# Patient Record
Sex: Female | Born: 1990
Health system: Southern US, Community
[De-identification: ages and names within clinical notes are randomized; demographics above are authoritative.]

## PROBLEM LIST (undated history)

## (undated) DIAGNOSIS — J45909 Unspecified asthma, uncomplicated: Secondary | ICD-10-CM

## (undated) DIAGNOSIS — N200 Calculus of kidney: Secondary | ICD-10-CM

## (undated) DIAGNOSIS — D571 Sickle-cell disease without crisis: Secondary | ICD-10-CM

## (undated) DIAGNOSIS — R109 Unspecified abdominal pain: Secondary | ICD-10-CM

## (undated) DIAGNOSIS — N289 Disorder of kidney and ureter, unspecified: Secondary | ICD-10-CM

## (undated) HISTORY — PX: UMBILICAL HERNIA REPAIR: SHX196

## (undated) HISTORY — PX: HERNIA REPAIR: SHX51

## (undated) HISTORY — DX: Sickle-cell disease without crisis: D57.1

## (undated) HISTORY — DX: Unspecified abdominal pain: R10.9

---
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
## 2012-09-16 ENCOUNTER — Emergency Department (HOSPITAL_COMMUNITY)
Admission: EM | Admit: 2012-09-16 | Discharge: 2012-09-16 | Disposition: A | Discharge status: Home / Self Care | Payer: Medicaid Other | Attending: Emergency Medicine | Admitting: Emergency Medicine

## 2012-09-16 ENCOUNTER — Encounter (HOSPITAL_COMMUNITY): Payer: Self-pay | Admitting: *Deleted

## 2012-09-16 DIAGNOSIS — IMO0001 Reserved for inherently not codable concepts without codable children: Secondary | ICD-10-CM | POA: Insufficient documentation

## 2012-09-16 DIAGNOSIS — R112 Nausea with vomiting, unspecified: Secondary | ICD-10-CM | POA: Insufficient documentation

## 2012-09-16 DIAGNOSIS — F17200 Nicotine dependence, unspecified, uncomplicated: Secondary | ICD-10-CM | POA: Insufficient documentation

## 2012-09-16 DIAGNOSIS — J111 Influenza due to unidentified influenza virus with other respiratory manifestations: Secondary | ICD-10-CM | POA: Insufficient documentation

## 2012-09-16 DIAGNOSIS — J020 Streptococcal pharyngitis: Secondary | ICD-10-CM

## 2012-09-16 DIAGNOSIS — D571 Sickle-cell disease without crisis: Secondary | ICD-10-CM | POA: Insufficient documentation

## 2012-09-16 LAB — CBC WITH DIFFERENTIAL/PLATELET
Basophils Absolute: 0 10*3/uL (ref 0.0–0.1)
Lymphs Abs: 1.7 10*3/uL (ref 0.7–4.0)
MCV: 75.5 fL — ABNORMAL LOW (ref 78.0–100.0)
Monocytes Absolute: 3.6 10*3/uL — ABNORMAL HIGH (ref 0.1–1.0)
Monocytes Relative: 11 % (ref 3–12)
Neutrophils Relative %: 84 % — ABNORMAL HIGH (ref 43–77)
Platelets: 185 10*3/uL (ref 150–400)
RDW: 13.7 % (ref 11.5–15.5)
WBC: 33 10*3/uL — ABNORMAL HIGH (ref 4.0–10.5)

## 2012-09-16 LAB — COMPREHENSIVE METABOLIC PANEL
AST: 30 U/L (ref 0–37)
Albumin: 3.6 g/dL (ref 3.5–5.2)
Calcium: 9.5 mg/dL (ref 8.4–10.5)
Creatinine, Ser: 0.81 mg/dL (ref 0.50–1.10)
Sodium: 135 mEq/L (ref 135–145)

## 2012-09-16 LAB — RAPID STREP SCREEN (MED CTR MEBANE ONLY): Streptococcus, Group A Screen (Direct): POSITIVE — AB

## 2012-09-16 MED ORDER — LIDOCAINE VISCOUS 2 % MT SOLN: 10.0000 mL | Freq: Once | OROMUCOSAL | Status: DC

## 2012-09-16 MED ORDER — PENICILLIN G BENZATHINE 1200000 UNIT/2ML IM SUSP
1.2000 10*6.[IU] | Freq: Once | INTRAMUSCULAR | Status: AC
  Administered 2012-09-16: 1.2 10*6.[IU] via INTRAMUSCULAR
  Filled 2012-09-16: qty 2

## 2012-09-16 MED ORDER — SODIUM CHLORIDE 0.9 % IV BOLUS (SEPSIS)
1000.0000 mL | Freq: Once | INTRAVENOUS | Status: AC
  Administered 2012-09-16: 1000 mL via INTRAVENOUS

## 2012-09-16 MED ORDER — LIDOCAINE VISCOUS 2 % MT SOLN
20.0000 mL | Freq: Once | OROMUCOSAL | Status: AC
  Administered 2012-09-16: 15 mL via OROMUCOSAL
  Filled 2012-09-16: qty 15

## 2012-09-16 MED ORDER — DEXAMETHASONE SODIUM PHOSPHATE 10 MG/ML IJ SOLN
10.0000 mg | Freq: Once | INTRAMUSCULAR | Status: AC
  Administered 2012-09-16: 10 mg via INTRAVENOUS
  Filled 2012-09-16: qty 1

## 2012-09-16 MED ORDER — KETOROLAC TROMETHAMINE 30 MG/ML IJ SOLN
30.0000 mg | Freq: Once | INTRAMUSCULAR | Status: AC
  Administered 2012-09-16: 30 mg via INTRAVENOUS
  Filled 2012-09-16: qty 1

## 2012-09-16 NOTE — ED Notes (Signed)
Urine sample collected if needed. 

## 2012-09-16 NOTE — ED Provider Notes (Signed)
History     CSN: [id number]  Arrival date & time 09/16/12  0750   First MD Initiated Contact with Patient 09/16/12 [phone]      Chief Complaint  Patient presents with  . Influenza    HPI  The patient presents with several days of multiple complaints.  She notes that prior to the onset of symptoms she was in her usual state of health.  Over the past 3 days she gradually had increasing his comfort in her throat and in her abdomen.  There is associated nausea with vomiting.  The vomiting is post prandial as well as random.  She denies diarrhea.  The pain in her abdomen is right upper quadrant, crampy.  Since onset she has been taking over-the-counter pain relievers with no relief.  She notes chills, denies fever.  She also comments of diffuse myalgia.  No confusion, disorientation.  Minimal cough.  Past Medical History  Diagnosis Date  . 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 Abdominal  pain, other specified site     Past Surgical History  Procedure Date  . Umbilical hernia repair   . Hernia repair     umbilical at age 79    Family History  Problem Relation Age of Onset  . Diabetes Mother   . Hyperlipidemia Mother     History  Substance Use Topics  . Smoking status: Current Every Day Smoker -- 1.0 packs/day  . Smokeless tobacco: Not on file  . Alcohol Use: No    OB History    Grav Para Term Preterm Abortions TAB SAB Ect Mult Living                  Review of Systems  Constitutional:       Per HPI, otherwise negative  HENT:       Per HPI, otherwise negative  Eyes: Negative.   Respiratory:       Per HPI, otherwise negative  Cardiovascular:       Per HPI, otherwise negative  Gastrointestinal: Positive for nausea and vomiting.  Genitourinary: Negative.   Musculoskeletal: Positive for myalgias.  Skin: Negative for rash.  Neurological: Negative for syncope.    Allergies  Review of patient's allergies indicates no known allergies.  Home Medications   Current  Outpatient Rx  Name  Route  Sig  Dispense  Refill  . ACETAMINOPHEN 500 MG PO TABS   Oral   Take 1,000 mg by mouth every 6 (six) hours as needed. For pain/fever         . IBUPROFEN 200 MG PO TABS   Oral   Take 400 mg by mouth every 6 (six) hours as needed. For pain/fever           BP 117/76  Pulse 115  Temp 99.4 F (37.4 C) (Oral)  Resp 18  SpO2 99%  Physical Exam  Nursing note and vitals reviewed. Constitutional: She is oriented to person, place, and time. She appears well-developed and well-nourished. No distress.  HENT:  Head: Normocephalic and atraumatic.  Mouth/Throat: Uvula is midline and mucous membranes are normal. Oropharyngeal exudate and posterior oropharyngeal erythema present. No posterior oropharyngeal edema or tonsillar abscesses.  Eyes: Conjunctivae normal and EOM are normal.  Cardiovascular: Regular rhythm.  Tachycardia present.   Pulmonary/Chest: Effort normal and breath sounds normal. No stridor. No respiratory distress.  Abdominal: She exhibits no distension. There is tenderness in the right upper quadrant. There is guarding and positive Murphy's sign.  There is no rigidity, no rebound, no CVA tenderness and no tenderness at McBurney's point.  Musculoskeletal: She exhibits no edema.  Neurological: She is alert and oriented to person, place, and time. No cranial nerve deficit.  Skin: Skin is warm and dry.  Psychiatric: She has a normal mood and affect.    ED Course  Procedures (including critical care time)   Labs Reviewed  CBC WITH DIFFERENTIAL  COMPREHENSIVE METABOLIC PANEL  LIPASE, BLOOD  RAPID STREP SCREEN   No results found.   No diagnosis found.   Following initiation of IVF, the patient began to feel better.  Initial labs c/w strep infection  11:25 AM HR mid 70's.  Patient resting comfortably MDM  This previously well young female presents with new sore throat, fever, myalgias.  On exam she has findings consistent with strep  pharyngitis.  This was demonstrated on lab tests.  Following IV fluids, steroids, analgesics patient substantially improved.  Absent rash, ongoing fever there is low suspicion for progression of systemic illness.  She was discharged in stable condition after discussion on return precautions.    Gerhard Munch, MD 09/16/12 1125

## 2012-09-16 NOTE — ED Notes (Signed)
Pt reports having flu like symptoms since Friday.  States that her throat hurts and she has body aches.  Unsure of fever or not.

## 2012-09-16 NOTE — ED Notes (Signed)
Call from lab.  Unable to run rapid strep due to order not crossing over.  Will re-order.

## 2012-12-31 ENCOUNTER — Encounter (HOSPITAL_COMMUNITY): Payer: Self-pay | Admitting: Emergency Medicine

## 2012-12-31 ENCOUNTER — Emergency Department (HOSPITAL_COMMUNITY)
Admission: EM | Admit: 2012-12-31 | Discharge: 2012-12-31 | Disposition: A | Discharge status: Home / Self Care | Payer: Medicaid Other | Attending: Emergency Medicine | Admitting: Emergency Medicine

## 2012-12-31 DIAGNOSIS — F17200 Nicotine dependence, unspecified, uncomplicated: Secondary | ICD-10-CM | POA: Insufficient documentation

## 2012-12-31 DIAGNOSIS — M79609 Pain in unspecified limb: Secondary | ICD-10-CM | POA: Insufficient documentation

## 2012-12-31 DIAGNOSIS — Z862 Personal history of diseases of the blood and blood-forming organs and certain disorders involving the immune mechanism: Secondary | ICD-10-CM | POA: Insufficient documentation

## 2012-12-31 DIAGNOSIS — M545 Low back pain, unspecified: Secondary | ICD-10-CM | POA: Insufficient documentation

## 2012-12-31 MED ORDER — HYDROCODONE-ACETAMINOPHEN 5-325 MG PO TABS
2.0000 | ORAL_TABLET | Freq: Once | ORAL | Status: AC
  Administered 2012-12-31: 2 via ORAL
  Filled 2012-12-31: qty 2

## 2012-12-31 MED ORDER — HYDROCODONE-ACETAMINOPHEN 5-325 MG PO TABS: 2.0000 | ORAL_TABLET | ORAL | Status: DC | PRN

## 2012-12-31 MED ORDER — GABAPENTIN 100 MG PO CAPS: 100.0000 mg | ORAL_CAPSULE | Freq: Three times a day (TID) | ORAL | Status: DC

## 2012-12-31 NOTE — ED Notes (Signed)
Pt c/o pain in left hip and leg x 1 month that runs down leg; pt denies obvious injury

## 2012-12-31 NOTE — ED Provider Notes (Signed)
History     CSN: [id number]  Arrival date & time 12/31/12  1012   First MD Initiated Contact with Patient 12/31/12 1144      Chief Complaint  Patient presents with  . Leg Pain    (Consider location/radiation/quality/duration/timing/severity/associated sxs/prior treatment) HPI Comments: 22 y.o. Female presents with gradual onset lower back pain for the last month. No trauma. Pt noticed it while she sitting down, a sharp, stabbing pain at left hip that radiated down her left leg to the popliteal fossa. 7/10. No trauma. Pt took no interventions. Hurts more when walking/weight bearing. Denies pain while sitting, numbness, bowel/bladder control, nausea, vomiting, headaches, visual disturbances.   Last BM was yesterday, normal.   [facility] wouldn't see her today so she came here.  Patient is a 22 y.o. female presenting with leg pain.  Leg Pain Associated symptoms: back pain   Associated symptoms: no fever and no neck pain     Past Medical History  Diagnosis Date  . 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 Abdominal  pain, other specified site     Past Surgical History  Procedure Laterality Date  . Umbilical hernia repair    . Hernia repair      umbilical at age 82    Family History  Problem Relation Age of Onset  . Diabetes Mother   . Hyperlipidemia Mother     History  Substance Use Topics  . Smoking status: Current Every Day Smoker -- 1.00 packs/day  . Smokeless tobacco: Not on file  . Alcohol Use: No    OB History   Grav Para Term Preterm Abortions TAB SAB Ect Mult Living                  Review of Systems  Constitutional: Negative for fever and diaphoresis.  HENT: Negative for neck pain and neck stiffness.   Eyes: Negative for visual disturbance.  Respiratory: Negative for apnea, chest tightness and shortness of breath.   Cardiovascular: Negative for chest pain and palpitations.  Gastrointestinal: Negative for nausea, vomiting, diarrhea and  constipation.  Genitourinary: Negative for dysuria.  Musculoskeletal: Positive for back pain. Negative for gait problem.       Lower back pain radiating down left leg  Skin: Negative for rash.  Neurological: Negative for dizziness, weakness, light-headedness, numbness and headaches.    Allergies  Review of patient's allergies indicates no known allergies.  Home Medications   Current Outpatient Rx  Name  Route  Sig  Dispense  Refill  . acetaminophen (TYLENOL) 500 MG tablet   Oral   Take 1,000 mg by mouth every 6 (six) hours as needed. For pain/fever           BP 107/71  Pulse 87  Temp(Src) 97.7 F (36.5 C) (Oral)  Resp 16  Ht 5\' 3"  (1.6 m)  Wt 135 lb (61.236 kg)  BMI 23.92 kg/m2  SpO2 99%  Physical Exam  Nursing note and vitals reviewed. Constitutional: She is oriented to person, place, and time. She appears well-developed and well-nourished. No distress.  HENT:  Head: Normocephalic and atraumatic.  Eyes: Conjunctivae and EOM are normal.  Neck: Normal range of motion. Neck supple.  No meningeal signs  Cardiovascular: Normal rate, regular rhythm and normal heart sounds.  Exam reveals no gallop and no friction rub.   No murmur heard. Pulmonary/Chest: Effort normal and breath sounds normal. No respiratory distress. She has no wheezes. She has no rales. She  exhibits no tenderness.  Abdominal: Soft. Bowel sounds are normal. She exhibits no distension. There is no tenderness. There is no rebound and no guarding.  Musculoskeletal: Normal range of motion. She exhibits no edema and no tenderness.  Painful external rotation of hip. Pain at straight leg test at 15 degrees.  Neurological: She is alert and oriented to person, place, and time. No cranial nerve deficit.  Skin: Skin is warm and dry. She is not diaphoretic. No erythema.    ED Course  Procedures (including critical care time)  Labs Reviewed - No data to display No results found.   Diagnosis: radicular  pain    MDM  Based on HPI, ROS, and PE, the fact that there was no trauma, seems to indicate radicular pain. Positive straight leg test at 15 degrees. Will refer to ortho for eval. Will prescribe some pain meds and some neurontin to see if it helps, but stressed the importance of following up with a primary doctor. Provided resource list.  At this time there does not appear to be any evidence of an acute emergency medical condition and the patient appears stable for discharge with appropriate outpatient follow up.Diagnosis was discussed with patient who verbalizes understanding and is agreeable to discharge.   Glade Nurse, PA-C 12/31/12 [phone]

## 2012-12-31 NOTE — ED Provider Notes (Signed)
Medical screening examination/treatment/procedure(s) were performed by non-physician practitioner and as supervising physician I was immediately available for consultation/collaboration.   Shelda Jakes, MD 12/31/12 [phone]

## 2013-11-07 ENCOUNTER — Emergency Department (HOSPITAL_COMMUNITY)
Admission: EM | Admit: 2013-11-07 | Discharge: 2013-11-07 | Disposition: A | Discharge status: Home / Self Care | Payer: Medicaid Other | Attending: Emergency Medicine | Admitting: Emergency Medicine

## 2013-11-07 ENCOUNTER — Encounter (HOSPITAL_COMMUNITY): Payer: Self-pay | Admitting: Emergency Medicine

## 2013-11-07 DIAGNOSIS — R1110 Vomiting, unspecified: Secondary | ICD-10-CM | POA: Insufficient documentation

## 2013-11-07 DIAGNOSIS — H6692 Otitis media, unspecified, left ear: Secondary | ICD-10-CM

## 2013-11-07 DIAGNOSIS — H6690 Otitis media, unspecified, unspecified ear: Secondary | ICD-10-CM | POA: Insufficient documentation

## 2013-11-07 DIAGNOSIS — F17200 Nicotine dependence, unspecified, uncomplicated: Secondary | ICD-10-CM | POA: Insufficient documentation

## 2013-11-07 DIAGNOSIS — H579 Unspecified disorder of eye and adnexa: Secondary | ICD-10-CM | POA: Insufficient documentation

## 2013-11-07 DIAGNOSIS — Z862 Personal history of diseases of the blood and blood-forming organs and certain disorders involving the immune mechanism: Secondary | ICD-10-CM | POA: Insufficient documentation

## 2013-11-07 DIAGNOSIS — J069 Acute upper respiratory infection, unspecified: Secondary | ICD-10-CM

## 2013-11-07 MED ORDER — AMOXICILLIN-POT CLAVULANATE 875-125 MG PO TABS: 1.0000 | ORAL_TABLET | Freq: Two times a day (BID) | ORAL | Status: DC

## 2013-11-07 NOTE — ED Provider Notes (Signed)
CSN: [id number]     Arrival date & time 11/07/13  1412 History  This chart was scribed for non-physician practitioner Raymon Mutton PA-C working with Gerhard Munch, MD by Elveria Rising, ED Scribe. This patient was seen in room TR08C/TR08C and the patient's care was started at 3:56 PM.   Chief Complaint  Patient presents with  . URI  . Otalgia    The history is provided by the patient. No language interpreter was used.   HPI Comments: Christina Avila is a 23 y.o. female who presents to the Emergency Department complaining of productive (clear sputum) cough and ear pain, onset 1 week ago. Patient reports associated eye tearing, and post tussive emesis. Patient has been treating her symptoms with NyQuil. Her mother states that she was not able to keep anything done. Denies fevers, chills, dizziness, nausea, diarrhea, sinus pressure, CP, dyspnea, neck pain or stiffness. Patient denies chest pain or difficulty breathing. Sick contacts at home. Patient did not receive seasonal flu vaccination.  Past Medical History  Diagnosis Date  . 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 Abdominal  pain, other specified site    Past Surgical History  Procedure Laterality Date  . Umbilical hernia repair    . Hernia repair      umbilical at age 47   Family History  Problem Relation Age of Onset  . Diabetes Mother   . Hyperlipidemia Mother    History  Substance Use Topics  . Smoking status: Current Every Day Smoker -- 1.00 packs/day  . Smokeless tobacco: Not on file  . Alcohol Use: No   OB History   Grav Para Term Preterm Abortions TAB SAB Ect Mult Living                 Review of Systems  Constitutional: Negative for fever.  HENT: Positive for ear pain. Negative for sinus pressure.   Respiratory: Positive for cough. Negative for shortness of breath.   Cardiovascular: Negative for chest pain.  Gastrointestinal: Negative for nausea, vomiting and diarrhea.  Musculoskeletal: Negative for neck  pain and neck stiffness.  Neurological: Negative for dizziness.  All other systems reviewed and are negative.    Allergies  Review of patient's allergies indicates no known allergies.  Home Medications   Current Outpatient Rx  Name  Route  Sig  Dispense  Refill  . acetaminophen (TYLENOL) 500 MG tablet   Oral   Take 1,000 mg by mouth every 6 (six) hours as needed. For pain/fever         . albuterol (PROVENTIL HFA;VENTOLIN HFA) 108 (90 BASE) MCG/ACT inhaler   Inhalation   Inhale 2 puffs into the lungs every 6 (six) hours as needed for wheezing or shortness of breath.         Marland Kitchen amoxicillin-clavulanate (AUGMENTIN) 875-125 MG per tablet   Oral   Take 1 tablet by mouth 2 (two) times daily. One po bid x 7 days   14 tablet   0    Triage Vitals: BP 139/66  Pulse 68  Temp(Src) 97.8 F (36.6 C) (Oral)  Resp 18  SpO2 98% Physical Exam  Nursing note and vitals reviewed. Constitutional: She is oriented to person, place, and time. She appears well-developed and well-nourished. No distress.  HENT:  Head: Normocephalic and atraumatic.  Right Ear: Hearing, tympanic membrane, external ear and ear canal normal. No drainage, swelling or tenderness. No mastoid tenderness. Tympanic membrane is not perforated, not erythematous and not bulging.  Left Ear: Hearing and ear canal normal. There is tenderness. No drainage or swelling. No mastoid tenderness. Tympanic membrane is erythematous and bulging. Tympanic membrane is not perforated.  Mouth/Throat: Oropharynx is clear and moist. No oropharyngeal exudate.  Left ear with mild erythema noted to the TM, mild bulging and fluid accumulation that is opaque in color. Discomfort upon palpation to the left ear. Negative pain upon palpation to the mastoid region of the left ear. Negative drainage. Negative TM perforation.   Discomfort upon palpation to the maxillary sinuses and frontal sinus  Eyes: Conjunctivae and EOM are normal. Pupils are equal,  round, and reactive to light. Right eye exhibits no discharge. Left eye exhibits no discharge.  Neck: Normal range of motion. Neck supple.  Negative cervical lymphadenopathy Negative neck stiffness Negative nuchal rigidity Negative meningeal signs  Cardiovascular: Normal rate, regular rhythm and normal heart sounds.  Exam reveals no friction rub.   Pulses:      Radial pulses are 2+ on the right side, and 2+ on the left side.  Pulmonary/Chest: Effort normal and breath sounds normal. No respiratory distress. She has no wheezes. She has no rales.  Musculoskeletal: Normal range of motion.  Full ROM to upper and lower extremities without difficulty noted, negative ataxia noted.  Lymphadenopathy:    She has no cervical adenopathy.  Neurological: She is alert and oriented to person, place, and time. No cranial nerve deficit. She exhibits normal muscle tone. Coordination normal.  Skin: Skin is warm and dry. No rash noted. She is not diaphoretic. No erythema.  Psychiatric: She has a normal mood and affect. Her behavior is normal. Thought content normal.    ED Course  Procedures (including critical care time) DIAGNOSTIC STUDIES: Oxygen Saturation is 98% on room air, normal by my interpretation.    COORDINATION OF CARE: 4:00 PM- Pt advised of plan for treatment and pt agrees.    Labs Review Labs Reviewed - No data to display Imaging Review No results found.  EKG Interpretation   None       MDM   1. Otitis media, left   2. URI, acute    Filed Vitals:   11/07/13 1419 11/07/13 1635  BP: 139/66 106/69  Pulse: 68 66  Temp: 97.8 F (36.6 C) 97.8 F (36.6 C)  TempSrc: Oral Oral  Resp: 18 17  SpO2: 98% 97%   I personally performed the services described in this documentation, which was scribed in my presence. The recorded information has been reviewed and is accurate.  Patient presenting to emergency department with bilateral talus and cold-like symptoms the been ongoing for  the past week. Reported that she's been using over-the-counter medication or minimal relief Alert and oriented. GCS 15. Heart rate and rhythm normal. Pulses palpable and strong. Radial 2+ bilaterally. Lungs clear to auscultation. Negative cervical lymphadenopathy. Negative nuchal rigidity. Negative meningeal signs. Left tympanic membrane identified to have mild erythema and mild bulging noted with accumulation of opaque fluid posterior to the tympanic membrane. Negative pain upon palpation to the mastoid region. Negative active drainage. TM intact - negative perforation. Bilateral maxillary sinus pressure and frontal sinus pressure, palpation. Doubt mastoiditis. Doubt otitis externa. Suspicion to be upper respiratory infection-beginnings of acute sinusitis with beginnings of otitis media to the left ear. Patient stable, afebrile. Discharged patient. Discharged patient with antibiotics. Referred patient to primary care provider. .Discussed with patient to closely monitor symptoms and if symptoms are to worsen or change to report back to the ED - strict  return instructions given.  Patient agreed to plan of care, understood, all questions answered.      Raymon Mutton, PA-C 11/08/13 [phone]

## 2013-11-07 NOTE — ED Notes (Signed)
Pt sts URI sx with cough and congestion and ear pain x 1 week; pt denies fever

## 2013-11-07 NOTE — Discharge Instructions (Signed)
Please call your doctor for a followup appointment within 24-48 hours. When you talk to your doctor please let them know that you were seen in the emergency department and have them acquire all of your records so that they can discuss the findings with you and formulate a treatment plan to fully care for your new and ongoing problems. Please call and set-up an appointment with your primary care provider regarding visit to the ED Please rest and stay hydrated Please take Tylenol to control fever if fever develops - no more than 4 grams/ 4,000 milligrams  Please continue monitor symptoms closely and if symptoms are to worsen or change (fever greater than 101, chills, nausea, neck pain, neck stiffness, shortness of breath, difficulty breathing, throat closing sensation, sore throat, and chest pain) please report back to emergency department immediately  Otitis Media, Adult A middle ear infection is an infection in the space behind the eardrum. The medical name for this is "otitis media." It may happen after a common cold. It is caused by a germ that starts growing in that space. You may feel swollen glands in your neck on the side of the ear infection. HOME CARE INSTRUCTIONS   Take your medicine as directed until it is gone, even if you feel better after the first few days.  Only take over-the-counter or prescription medicines for pain, discomfort, or fever as directed by your caregiver.  Occasional use of a nasal decongestant a couple times per day may help with discomfort and help the eustachian tube to drain better. Follow up with your caregiver in 10 to 14 days or as directed, to be certain that the infection has cleared. Not keeping the appointment could result in a chronic or permanent injury, pain, hearing loss and disability. If there is any problem keeping the appointment, you must call back to this facility for assistance. SEEK IMMEDIATE MEDICAL CARE IF:   You are not getting better in 2 to  3 days.  You have pain that is not controlled with medication.  You feel worse instead of better.  You cannot use the medication as directed.  You develop swelling, redness or pain around the ear or stiffness in your neck. MAKE SURE YOU:   Understand these instructions.  Will watch your condition.  Will get help right away if you are not doing well or get worse. Document Released: 07/28/2004 Document Revised: 01/15/2012 Document Reviewed: 05/20/2013 [facility] Patient Information 2014 [place], [place].   Emergency Department Resource Guide 1) Find a Doctor and Pay Out of Pocket Although you won't have to find out who is covered by your insurance plan, it is a good idea to ask around and get recommendations. You will then need to call the office and see if the doctor you have chosen will accept you as a new patient and what types of options they offer for patients who are self-pay. Some doctors offer discounts or will set up payment plans for their patients who do not have insurance, but you will need to ask so you aren't surprised when you get to your appointment.  2) Contact Your Local Health Department Not all health departments have doctors that can see patients for sick visits, but many do, so it is worth a call to see if yours does. If you don't know where your local health department is, you can check in your phone book. The CDC also has a tool to help you locate your state's health department, and many state websites  also have listings of all of their local health departments.  3) Find a Walk-in Clinic If your illness is not likely to be very severe or complicated, you may want to try a walk in clinic. These are popping up all over the country in pharmacies, drugstores, and shopping centers. They're usually staffed by nurse practitioners or physician assistants that have been trained to treat common illnesses and complaints. They're usually fairly quick and inexpensive. However, if  you have serious medical issues or chronic medical problems, these are probably not your best option.  No Primary Care Doctor: - Call Health Connect at  [phone] - they can help you locate a primary care doctor that  accepts your insurance, provides certain services, etc. - Physician Referral Service- [phone]  Chronic Pain Problems: Organization         Address  Phone   Notes  [facility]  [phone] Patients need to be referred by their primary care doctor.   Medication Assistance: Organization         Address  Phone   Notes  [facility] Medication [facility] [address] [place] [place], [place] [address] [phone] --Must be a resident of [facility] -- Must have NO insurance coverage whatsoever (no Medicaid/ Medicare, etc.) -- The pt. MUST have a primary care doctor that directs their care regularly and follows them in the community   [facility]  [phone]   [employer]  [phone]    Agencies that provide inexpensive medical care: Organization         Address  Phone   Notes  [facility]  [phone]   [facility]    [phone]   [facility] [address] [place], [place] [address] [phone]   [facility] [address] [place] [place] [phone]   [facility]anned Parenthood    [phone]   [facility]    [phone]   [facility]  [address], [place] Phone:  [phone], Fax:  [phone] Hours of Operation:  9 am - 6 pm, M-F.  Also accepts Medicaid/Medicare and self-pay.  [facility]  [address], [place] Phone: [phone], Fax: [phone]. Hours of Operation:  8:30 am - 5:30 pm, M-F.  Also accepts Medicaid and self-pay.  [facility] [place] [address], [place] Phone: [phone]   [facility] [address] Natasha Bence [place], [place] [phone], Ext. [phone] Mondays & Thursdays: 7-9 AM.  First 15 patients are seen on a first come, first serve basis.    Medicaid-accepting [facility] Providers:  Organization         Address  Phone   Notes  [facility] [address], [place] [phone] Also accepts self-pay patients.  [facility] [address] Laurell Josephs [place], [place]  [phone]   [facility] [address], [place] [phone]   [facility] [address] [place] [phone]   Renaye Rakers [address], [place]   [phone] Only accepts [place] patients after they have their name applied to their card.   Self-Pay (no insurance) in [facility]:  [employer]  Notes  Sickle Cell Patients, [facility] [address], [place] [phone]   [facility] [address], [place] [phone]   [facility] [place]  [address], [place] [phone]   [facility]/Dr. Osei-Bonsu  [address] [place] [address], [place] or [phone], [place] [phone] Phone number for both [place] and [place] locations is the same.  [facility] [address], [place] [phone]   [facility] [place] [address] [place] [address], [place] or [address] [phone]) [phone]   [facility] [address], [place] [phone]) [phone], phone; [phone], fax Sees patients 1st and 3rd Saturday of every month.  Must not qualify for public or private insurance (i.e. Medicaid, Medicare, [place] Health Choice, Veterans' Benefits)  Household income should be no more than 200% of the poverty level The clinic cannot treat you if you are pregnant or think you are  pregnant  Sexually transmitted diseases are not treated at the clinic.    Dental Care: Organization         Address  Phone  Notes  [facility] [address], [place] [phone] Accepts children up to age 21 who are enrolled in [place] or [place] Health Choice; pregnant women with a Medicaid card; and children who have applied for Medicaid or [place] Health Choice, but were declined, whose parents can pay a reduced fee at time of service.  [facility] [place]  [address], [place] [phone] Accepts children up to age 21 who are enrolled in [place] or [place] Health Choice; pregnant women with a Medicaid card; and children who have applied for Medicaid or [place] Health Choice, but were declined, whose parents can pay a reduced fee at time of service.  [facility]  [address], [place] [phone] Patients are seen by appointment only. Walk-ins are not accepted. [facility] will see patients 21 years of age and older. Monday - Tuesday (8am-5pm) Most Wednesdays (8:30-5pm) $30 per visit, cash only  [facility]  [address], [facility] [phone] Patients are seen by appointment only. Walk-ins are not accepted. [facility] will see patients 21 years of age and older. One Wednesday Evening (Monthly: Volunteer Based).  $30 per visit, cash only  [employer] [facility] [facility]  [phone] for adults; Children under age 534, call Graduate Pediatric Dentistry at [phone]. Children aged 784-14, please call [phone] to request a pediatric application.  Dental services are provided in all areas of dental care including fillings, crowns and bridges, complete and partial dentures, implants, gum treatment, root canals, and extractions. Preventive care is also provided. Treatment is provided to both adults and  children. Patients are selected via a lottery and there is often a waiting list.   [facility] [address], [place]  [phone] [email or web address]   [facility] [address], [place], [place] [phone], Ext. [phone] Second and Fourth Thursday of each month, opens at 6:30 AM; Clinic ends at 9 AM.  Patients are seen on a first-come first-served basis, and a limited number are seen during each clinic.   [facility]  [address], [place], [place] [phone]) [phone]  Eligibility Requirements °You must have lived in [place], [place], or [place] counties for at least the last three months. °  You cannot be eligible for state or federal sponsored healthcare insurance, including Veterans Administration, Medicaid, or Medicare. °  You generally cannot be eligible for healthcare insurance through your employer.  °  How to apply: °Eligibility screenings are held every Tuesday and Wednesday afternoon from 1:00 pm until 4:00 pm. You do not need an appointment for the interview!  °[facility] [address], [place], [place] [phone]   °[facility]  336-342-8273   °[facility]  336-703-3100   °[facility]  336-570-6415   ° °Behavioral Health Resources in the Community: °Intensive Outpatient Programs °Organization         Address  Phone  Notes  °[facility] [place] [facility] [address], [place], [place] [phone]   °[facility] [address], [place], [place] [phone]   °[facility] [address], [place], [place] ° 336-882-2125   °[facility] [address],  °[place], [place] 1-800-853-5163 or 336-641-4981   °Substance Abuse Resources °Organization         Address  Phone  Notes  °[facility]  336-882-2125   °[facility]  336-784-9470   °[facility]  336-285-9073     °[facility]  336-845-3988   °Residential & Outpatient Substance Abuse Program  1-800-659-3381   °Psychological Services °Organization         Address  Phone  Notes  °[facility]  336- 832-9600   °[facility]  336- 378-7881   °[facility] [address], [place] 1-800-853-5163 or 336-641-4981   ° °Mobile Crisis Teams °Organization         Address  Phone  Notes  °[facility], Mobile Crisis Care Unit  1-877-626-1772   °Assertive °Psychotherapeutic Services ° [address] [place], [place] 336-834-9664   °Sharon DeEsch [address] °[place] 336-554-5454   ° °Self-Help/Support Groups °Organization         Address  Phone             Notes  °[facility] [place] - variety of support groups  336- 373-1402 Call for more information  °Narcotics Anonymous (NA), [facility] [address], °[place]  2 meetings at this location  ° °Residential Treatment Programs °Organization         Address  Phone  Notes  °[facility] [address],    °[place]  1-866-801-8205   °[facility] ° [address], [place], [place] 704-293-8524   °[facility] [address], [place] 336-845-3988 Admissions: 8am-3pm M-F  °[facility] [address]    °[place], [place] 336-841-1104   °[facility] [address], [place], [place] 336-379-7146   °[facility] [address]  °[place], [place] 336-285-9073   °[facility] - Intensive Outpatient [address], [place], [place] 336-852-3033   °[facility] ([facility] [address]  °[place], [place] 1-877-615-2722 or 336-784-9470   °[facility] ([facility]) [address] [place], [place] 336-227-7417 Accepts Medicaid  °[facility] [address]  °[place] 1-800-659-3381 Substance Abuse/Addiction Treatment  ° °[place]  Behavioral Health Resources °Organization           Address  Phone  Notes  °[facility]  (888) 581-9988   °Julie Brannon, PhD [address] [place], [place]   (336) 349-5553 or (336) 951-0000   °[facility]   [address] °[place], [place] (336) 349-4454   °[facility] [address], [place], [place] (336) 342-8316 Insurance/Medicaid/sponsorship through [facility]  °[facility] [address]                                    [place], [place] (336) 342-8316 Therapy/tele-psych/case  °[facility] [address]  ° [place], [place] (336) 349-2233    °Dr. Arfeen  (336) 349-4544   °[facility] [place]  [employer] [place] [facility] 1) [address], [place] °2) [address], [place] °3)  [address], [place] (336) 349-3220 °(336) 342-7768 ° °(336) 342-8140   °[place] Child Abuse Hotline (336) 342-1394 or (336) 342-3537 (After Hours)    ° ° ° °

## 2013-11-09 NOTE — ED Provider Notes (Signed)
  Medical screening examination/treatment/procedure(s) were performed by non-physician practitioner and as supervising physician I was immediately available for consultation/collaboration.  EKG Interpretation   None          Parag Dorton, MD 11/09/13 0004 

## 2016-01-27 ENCOUNTER — Emergency Department (HOSPITAL_COMMUNITY)
Admission: EM | Admit: 2016-01-27 | Discharge: 2016-01-27 | Disposition: A | Discharge status: Home / Self Care | Payer: Medicaid Other | Attending: Emergency Medicine | Admitting: Emergency Medicine

## 2016-01-27 ENCOUNTER — Encounter (HOSPITAL_COMMUNITY): Payer: Self-pay

## 2016-01-27 DIAGNOSIS — J029 Acute pharyngitis, unspecified: Secondary | ICD-10-CM | POA: Insufficient documentation

## 2016-01-27 DIAGNOSIS — R1013 Epigastric pain: Secondary | ICD-10-CM | POA: Insufficient documentation

## 2016-01-27 DIAGNOSIS — Z79899 Other long term (current) drug therapy: Secondary | ICD-10-CM | POA: Insufficient documentation

## 2016-01-27 DIAGNOSIS — Z862 Personal history of diseases of the blood and blood-forming organs and certain disorders involving the immune mechanism: Secondary | ICD-10-CM | POA: Insufficient documentation

## 2016-01-27 DIAGNOSIS — R112 Nausea with vomiting, unspecified: Secondary | ICD-10-CM | POA: Insufficient documentation

## 2016-01-27 DIAGNOSIS — R231 Pallor: Secondary | ICD-10-CM | POA: Insufficient documentation

## 2016-01-27 DIAGNOSIS — R6889 Other general symptoms and signs: Secondary | ICD-10-CM

## 2016-01-27 DIAGNOSIS — F17200 Nicotine dependence, unspecified, uncomplicated: Secondary | ICD-10-CM | POA: Insufficient documentation

## 2016-01-27 HISTORY — DX: Unspecified asthma, uncomplicated: J45.909

## 2016-01-27 LAB — RAPID STREP SCREEN (MED CTR MEBANE ONLY): STREPTOCOCCUS, GROUP A SCREEN (DIRECT): NEGATIVE

## 2016-01-27 MED ORDER — ONDANSETRON 4 MG PO TBDP
4.0000 mg | ORAL_TABLET | Freq: Once | ORAL | Status: AC
  Administered 2016-01-27: 4 mg via ORAL
  Filled 2016-01-27: qty 1

## 2016-01-27 MED ORDER — IBUPROFEN 800 MG PO TABS: 800.0000 mg | ORAL_TABLET | Freq: Three times a day (TID) | ORAL | Status: DC

## 2016-01-27 MED ORDER — KETOROLAC TROMETHAMINE 30 MG/ML IJ SOLN
30.0000 mg | Freq: Once | INTRAMUSCULAR | Status: AC
  Administered 2016-01-27: 30 mg via INTRAMUSCULAR
  Filled 2016-01-27: qty 1

## 2016-01-27 MED ORDER — ONDANSETRON 4 MG PO TBDP: 4.0000 mg | ORAL_TABLET | Freq: Three times a day (TID) | ORAL | Status: DC | PRN

## 2016-01-27 NOTE — ED Notes (Signed)
Pt from home with flu like symptoms that started Monday. Pt feels weak and has a cough

## 2016-01-27 NOTE — Discharge Instructions (Signed)
Your strep test was negative. You likely have a virus, possibly the flu. I will give you prescriptions to help with your nausea and your body aches/chills/sore throat. Please follow up with your primary care provider within one week. Return to the ER for new or worsening symptoms.

## 2016-01-27 NOTE — ED Provider Notes (Signed)
CSN: [id number]     Arrival date & time 01/27/16  [id number] History  By signing my name below, I, Christina Avila, attest that this documentation has been prepared under the direction and in the presence of Christina Avila, [place]. Electronically Signed: Doreatha MartinEva Avila, ED Scribe. 01/27/2016. 7:23 PM.    No chief complaint on file.  The history is provided by the patient. No language interpreter was used.   HPI Comments: Christina Avila is a 25 y.o. female who presents to the Emergency Department complaining of moderate generalized myalgias onset 3 days ago with associated nausea, emesis, dry cough, sore throat, epigastric abdominal pain. Last episode of emesis today. Pt states her myalgias and sore throat are currently her worst symptoms. Pt states she has taken Theraflu with no relief of symptoms. No known sick contact. She denies fever, chills.   Past Medical History  Diagnosis Date  . 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 Abdominal pain, other specified site    Past Surgical History  Procedure Laterality Date  . Umbilical hernia repair    . Hernia repair      umbilical at age 25   Family History  Problem Relation Age of Onset  . Diabetes Mother   . Hyperlipidemia Mother    Social History  Substance Use Topics  . Smoking status: Current Every Day Smoker -- 1.00 packs/day  . Smokeless tobacco: Not on file  . Alcohol Use: No   OB History    No data available     Review of Systems  Constitutional: Negative for fever and chills.  HENT: Positive for sore throat.   Respiratory: Positive for cough.   Gastrointestinal: Positive for nausea, vomiting and abdominal pain.  Musculoskeletal: Positive for myalgias.  All other systems reviewed and are negative.  Allergies  Review of patient's allergies indicates no known allergies.  Home Medications   Prior to Admission medications   Medication Sig Start Date End Date Taking? Authorizing Provider  acetaminophen (TYLENOL) 500 MG tablet Take  1,000 mg by mouth every 6 (six) hours as needed. For pain/fever    Historical Provider, MD  albuterol (PROVENTIL HFA;VENTOLIN HFA) 108 (90 BASE) MCG/ACT inhaler Inhale 2 puffs into the lungs every 6 (six) hours as needed for wheezing or shortness of breath.    Historical Provider, MD  amoxicillin-clavulanate (AUGMENTIN) 875-125 MG per tablet Take 1 tablet by mouth 2 (two) times daily. One po bid x 7 days 11/07/13   [name], PA-C   BP 130/76 mmHg  Pulse 77  Temp(Src) 98.3 F (36.8 C)  Resp 16  SpO2 100% Physical Exam  Constitutional: She is oriented to person, place, and time. She appears well-developed and well-nourished.  HENT:  Head: Normocephalic and atraumatic.  Right Ear: External ear normal.  Left Ear: External ear normal.  Nose: Nose normal.  Mouth/Throat: Mucous membranes are normal. Posterior oropharyngeal erythema present. No oropharyngeal exudate or posterior oropharyngeal edema.  Eyes: Conjunctivae and EOM are normal. Pupils are equal, round, and reactive to light.  Neck: Normal range of motion. Neck supple.  Cardiovascular: Normal rate and regular rhythm.   Pulmonary/Chest: Effort normal and breath sounds normal. No respiratory distress. She has no wheezes. She has no rales.  Abdominal: Soft. Bowel sounds are normal. She exhibits no distension. There is no tenderness.  Musculoskeletal: Normal range of motion.  Neurological: She is alert and oriented to person, place, and time.  Skin: Skin is warm and dry. There is  pallor.  Psychiatric: She has a normal mood and affect. Her behavior is normal.  Nursing note and vitals reviewed.   ED Course  Procedures (including critical care time) DIAGNOSTIC STUDIES: Oxygen Saturation is 100% on RA, normal by my interpretation.    COORDINATION OF CARE: 7:21 PM Discussed treatment plan with pt at bedside which includes rapid strep and pt agreed to plan.   Labs Review Labs Reviewed  RAPID STREP SCREEN (NOT AT [facility])  CULTURE,  GROUP A STREP [facility])    I have personally reviewed and evaluated these lab results as part of my medical decision-making.   MDM   Final diagnoses:  Flu-like symptoms    Rapid strep negative. Suspect viral illness, likely the flu. Pt is >48 hours from symptom onset. Will hold off on Tamiflu. Zofran and toradol given in the ED with improvement in symptoms. Pt is tolerating PO. Rx given for zofran and ibuprofen. Encouraged plenty of fluids. Instructed to f/u with PCP within one week. ER return precautions given.  I personally performed the services described in this documentation, which was scribed in my presence. The recorded information has been reviewed and is accurate.   [name], PA-C 01/27/16 2057  Loren Racer, MD 01/27/16 2222

## 2016-01-30 LAB — CULTURE, GROUP A STREP (THRC)

## 2016-05-31 ENCOUNTER — Encounter (HOSPITAL_COMMUNITY): Payer: Self-pay

## 2016-05-31 ENCOUNTER — Emergency Department (HOSPITAL_COMMUNITY)
Admission: EM | Admit: 2016-05-31 | Discharge: 2016-05-31 | Disposition: A | Discharge status: Home / Self Care | Payer: Medicaid Other | Attending: Emergency Medicine | Admitting: Emergency Medicine

## 2016-05-31 DIAGNOSIS — B9789 Other viral agents as the cause of diseases classified elsewhere: Secondary | ICD-10-CM | POA: Insufficient documentation

## 2016-05-31 DIAGNOSIS — Z79899 Other long term (current) drug therapy: Secondary | ICD-10-CM | POA: Insufficient documentation

## 2016-05-31 DIAGNOSIS — J45909 Unspecified asthma, uncomplicated: Secondary | ICD-10-CM | POA: Insufficient documentation

## 2016-05-31 DIAGNOSIS — J028 Acute pharyngitis due to other specified organisms: Secondary | ICD-10-CM | POA: Insufficient documentation

## 2016-05-31 DIAGNOSIS — F17210 Nicotine dependence, cigarettes, uncomplicated: Secondary | ICD-10-CM | POA: Insufficient documentation

## 2016-05-31 DIAGNOSIS — J029 Acute pharyngitis, unspecified: Secondary | ICD-10-CM

## 2016-05-31 LAB — RAPID STREP SCREEN (MED CTR MEBANE ONLY): Streptococcus, Group A Screen (Direct): NEGATIVE

## 2016-05-31 MED ORDER — FLUTICASONE PROPIONATE 50 MCG/ACT NA SUSP: 2.0000 | Freq: Every day | NASAL | 2 refills | Status: DC

## 2016-05-31 NOTE — ED Notes (Signed)
Provider at bedside

## 2016-05-31 NOTE — ED Provider Notes (Signed)
[facility]Y DEPT Provider Note   CSN: [id number] Arrival date & time: 05/31/16  1041  First Provider Contact:  None    By signing my name below, I, Christina Avila, attest that this documentation has been prepared under the direction and in the presence of [name], PA-C Electronically Signed: Soijett Avila, ED Scribe. 05/31/16. 1:51 PM.    History   Chief Complaint Chief Complaint  Patient presents with  . Sore Throat    HPI Christina Avila is a 25 y.o. female who presents to the Emergency Department complaining of sore throat onset 3 days. Pt notes that she has seasonal allergies and she used to take zyrtec for her symptoms, but she doesn't take any at this time. She states that she is having associated symptoms of nasal congestion, ear fullness, rhinorrhea, and sneezing. She states that she has not tried any medications for the relief for her symptoms. She denies cough, fever, chills, rash, and any other symptoms. Denies sick contacts. Denies allergies to medications.   The history is provided by the patient. No language interpreter was used.    Past Medical History:  Diagnosis Date  . Abdominal pain, other specified site   . Asthma   . 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 Hernia     Past Surgical History:  Procedure Laterality Date  . HERNIA REPAIR     umbilical at age 39  . UMBILICAL HERNIA REPAIR      OB History    No data available       Home Medications    Prior to Admission medications   Medication Sig Start Date End Date Taking? Authorizing Provider  acetaminophen (TYLENOL) 500 MG tablet Take 1,000 mg by mouth every 6 (six) hours as needed. For pain/fever    Historical Provider, MD  albuterol (PROVENTIL HFA;VENTOLIN HFA) 108 (90 BASE) MCG/ACT inhaler Inhale 2 puffs into the lungs every 6 (six) hours as needed for wheezing or shortness of breath.    Historical Provider, MD    amoxicillin-clavulanate (AUGMENTIN) 875-125 MG per tablet Take 1 tablet by mouth 2 (two) times daily. One po bid x 7 days 11/07/13   [name], PA-C  fluticasone (FLONASE) 50 MCG/ACT nasal spray Place 2 sprays into both nostrils daily. 05/31/16   [name], PA-C  ibuprofen (ADVIL,MOTRIN) 800 MG tablet Take 1 tablet (800 mg total) by mouth 3 (three) times daily. 01/27/16   [name], PA-C  ondansetron (ZOFRAN ODT) 4 MG disintegrating tablet Take 1 tablet (4 mg total) by mouth every 8 (eight) hours as needed for nausea or vomiting. 01/27/16   [name], PA-C    Family History Family History  Problem Relation Age of Onset  . Diabetes Mother   . Hyperlipidemia Mother     Social History Social History  Substance Use Topics  . Smoking status: Current Every Day Smoker    Packs/day: 1.00    Types: Cigarettes  . Smokeless tobacco: Never Used  . Alcohol use 0.0 oz/week     Comment: OCC     Allergies   Review of patient's allergies indicates no known allergies.   Review of Systems Review of Systems  Constitutional: Negative for chills and fever.  HENT: Positive for congestion, rhinorrhea, sneezing and sore throat.        Ear fullness  Respiratory: Negative for cough.      Physical Exam  Updated Vital Signs BP 114/81 (BP Location: Right Arm)   Pulse 67   Temp 97.6 F (36.4 C) (Oral)   Resp 20   Ht 5\' 3"  (1.6 m)   Wt 75.7 kg   SpO2 100%   BMI 29.57 kg/m   Physical Exam  Constitutional: She is oriented to person, place, and time. She appears well-developed and well-nourished. No distress.  HENT:  Head: Normocephalic and atraumatic.  Right Ear: Tympanic membrane, external ear and ear canal normal.  Left Ear: Tympanic membrane, external ear and ear canal normal.  Nose: Rhinorrhea present.  Mouth/Throat: Uvula is midline and mucous membranes are normal. Posterior oropharyngeal erythema present. No posterior oropharyngeal edema.  Mild erythema. No significant  swelling. No vesicles.   Eyes: EOM are normal. Pupils are equal, round, and reactive to light.  Neck: Neck supple.  Cardiovascular: Normal rate and regular rhythm.   Pulmonary/Chest: Effort normal and breath sounds normal. No respiratory distress. She has no wheezes. She has no rales.  Abdominal: She exhibits no distension.  Musculoskeletal: Normal range of motion.  Neurological: She is alert and oriented to person, place, and time.  Skin: Skin is warm and dry.  Psychiatric: She has a normal mood and affect. Her behavior is normal.  Nursing note and vitals reviewed.    ED Treatments / Results  DIAGNOSTIC STUDIES: Oxygen Saturation is 100% on RA, nl by my interpretation.    COORDINATION OF CARE: 1:51 PM Discussed treatment plan with pt at bedside which includes rapid strep screen and culture, warm salt water gargles, alternate tylenol or ibuprofen PRN, and pt agreed to plan.   Labs (all labs ordered are listed, but only abnormal results are displayed) Labs Reviewed  RAPID STREP SCREEN (NOT AT [facility])  CULTURE, GROUP A STREP [facility])    Procedures Procedures (including critical care time)  Medications Ordered in ED Medications - No data to display   Initial Impression / Assessment and Plan / ED Course  I have reviewed the triage vital signs and the nursing notes.  Pertinent labs that were available during my care of the patient were reviewed by me and considered in my medical decision making (see chart for details).  Clinical Course    Final Clinical Impressions(s) / ED Diagnoses   Final diagnoses:  Viral pharyngitis    New Prescriptions Discharge Medication List as of 05/31/2016  1:53 PM     Labs: rapid strep screen and culture  Imaging:   Consults:  Therapeutics:   Discharge Meds:   Assessment/Plan: Pt presents with likely viral pharyngitis. No difficulty swallowing, drooling, dysphonia, muffled voice, stridor, swelling of the neck, trismus, mouth pain,  swelling/ pain in submandibular area or floor of mouth, assymetry of tonsils, or ulcerations; unlikely epiglottitis, PTA, submandibular space infection, retropharyngeal space infection, or HIV. Pt treated here in the ED with therapeutics listed above, given strict return precautions, PCP follow-up for re-evaluation if symptoms persist beyond 3-5 days in duration, return to the ED if they worsen. Pt verbalized understanding and agreement to today's plan and had no further questions or concerns at the time of discharge.  I personally performed the services described in this documentation, which was scribed in my presence. The recorded information has been reviewed and is accurate. }    [name], PA-C 05/31/16 1451    Charlynne Pander, MD 05/31/16 [phone]

## 2016-05-31 NOTE — Discharge Instructions (Signed)
Please use Tylenol or ibuprofen as needed for discomfort. Warm saltwater gargles, Flonase, any part. Follow-up with her primary care in 3-5 days for reevaluation.

## 2016-06-02 LAB — CULTURE, GROUP A STREP (THRC)

## 2016-12-19 ENCOUNTER — Encounter (HOSPITAL_COMMUNITY): Payer: Self-pay

## 2016-12-19 ENCOUNTER — Emergency Department (HOSPITAL_COMMUNITY)
Admission: EM | Admit: 2016-12-19 | Discharge: 2016-12-19 | Disposition: A | Discharge status: Home / Self Care | Payer: Medicaid Other | Attending: Emergency Medicine | Admitting: Emergency Medicine

## 2016-12-19 DIAGNOSIS — Z79899 Other long term (current) drug therapy: Secondary | ICD-10-CM | POA: Insufficient documentation

## 2016-12-19 DIAGNOSIS — M545 Low back pain, unspecified: Secondary | ICD-10-CM

## 2016-12-19 DIAGNOSIS — T148XXA Other injury of unspecified body region, initial encounter: Secondary | ICD-10-CM

## 2016-12-19 DIAGNOSIS — F17210 Nicotine dependence, cigarettes, uncomplicated: Secondary | ICD-10-CM | POA: Insufficient documentation

## 2016-12-19 DIAGNOSIS — J45909 Unspecified asthma, uncomplicated: Secondary | ICD-10-CM | POA: Insufficient documentation

## 2016-12-19 DIAGNOSIS — X58XXXA Exposure to other specified factors, initial encounter: Secondary | ICD-10-CM | POA: Insufficient documentation

## 2016-12-19 DIAGNOSIS — S39012A Strain of muscle, fascia and tendon of lower back, initial encounter: Secondary | ICD-10-CM | POA: Insufficient documentation

## 2016-12-19 DIAGNOSIS — Y939 Activity, unspecified: Secondary | ICD-10-CM | POA: Insufficient documentation

## 2016-12-19 DIAGNOSIS — Y929 Unspecified place or not applicable: Secondary | ICD-10-CM | POA: Insufficient documentation

## 2016-12-19 DIAGNOSIS — Y999 Unspecified external cause status: Secondary | ICD-10-CM | POA: Insufficient documentation

## 2016-12-19 LAB — URINALYSIS, ROUTINE W REFLEX MICROSCOPIC
Bilirubin Urine: NEGATIVE
GLUCOSE, UA: NEGATIVE mg/dL
Hgb urine dipstick: NEGATIVE
Ketones, ur: NEGATIVE mg/dL
LEUKOCYTES UA: NEGATIVE
NITRITE: NEGATIVE
PH: 5 (ref 5.0–8.0)
Protein, ur: NEGATIVE mg/dL
SPECIFIC GRAVITY, URINE: 1.025 (ref 1.005–1.030)

## 2016-12-19 LAB — POC URINE PREG, ED: Preg Test, Ur: NEGATIVE

## 2016-12-19 MED ORDER — CYCLOBENZAPRINE HCL 10 MG PO TABS: 10.0000 mg | ORAL_TABLET | Freq: Two times a day (BID) | ORAL | 0 refills | Status: DC | PRN

## 2016-12-19 NOTE — ED Triage Notes (Signed)
Patient complains of left side pain x 4 days with dark urine and mild dysuria. No  Nausea, no vomiting

## 2016-12-19 NOTE — ED Provider Notes (Signed)
[facility] Provider Note   CSN: [id number] Arrival date & time: 12/19/16  1342  By signing my name below, I, Sonum Patel, attest that this documentation has been prepared under the direction and in the presence of Alvira MondayErin Koltin Wehmeyer, MD. Electronically Signed: Sonum Patel, Neurosurgeoncribe. 12/19/16. 4:53 PM.  History   Chief Complaint No chief complaint on file.  The history is provided by the patient. No language interpreter was used.     HPI Comments: Christina Avila is a 26 y.o. female who presents to the Emergency Department complaining of intermittent left lower back pain that began 4 days ago. She states the pain is present with voiding and defecating. She notes having a cough a few days ago which caused her back to hurt. She describes the pain as sharp in nature and rates it as a 6/10. She states the pain is unchanged with movement. She denies pain at rest. She denies dysuria, vaginal bleeding, vaginal discharge, nausea, vomiting, fever, diarrhea, new constipation.   She also complains of intermittent frontal HAs for the past year. She notes having daily HAs that start in the middle of the day while she is completing daily tasks. She states the daily HAs began 353yr ago. She works third shift and states she has trouble sleeping during the day and questions if this contributes to her HAs. She denies associated nausea, vomiting, unilateral weakness or sensory deficits.   Past Medical History:  Diagnosis Date  . Abdominal pain, other specified site   . Asthma   . 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 Hernia     Past Surgical History:  Procedure Laterality Date  . HERNIA REPAIR     umbilical at age 26  . UMBILICAL HERNIA REPAIR      OB History    No data available       Home Medications    Prior to Admission medications   Medication Sig Start Date End Date Taking? Authorizing Provider  acetaminophen (TYLENOL)  500 MG tablet Take 1,000 mg by mouth every 6 (six) hours as needed. For pain/fever    Historical Provider, MD  albuterol (PROVENTIL HFA;VENTOLIN HFA) 108 (90 BASE) MCG/ACT inhaler Inhale 2 puffs into the lungs every 6 (six) hours as needed for wheezing or shortness of breath.    Historical Provider, MD  amoxicillin-clavulanate (AUGMENTIN) 875-125 MG per tablet Take 1 tablet by mouth 2 (two) times daily. One po bid x 7 days 11/07/13   [name], PA-C  cyclobenzaprine (FLEXERIL) 10 MG tablet Take 1 tablet (10 mg total) by mouth 2 (two) times daily as needed for muscle spasms. 12/19/16   Alvira MondayErin Kolbey Teichert, MD  fluticasone (FLONASE) 50 MCG/ACT nasal spray Place 2 sprays into both nostrils daily. 05/31/16   Eyvonne MechanicJeffrey Hedges, PA-C  ibuprofen (ADVIL,MOTRIN) 800 MG tablet Take 1 tablet (800 mg total) by mouth 3 (three) times daily. 01/27/16   Ace GinsSerena Y Sam, PA-C  ondansetron (ZOFRAN ODT) 4 MG disintegrating tablet Take 1 tablet (4 mg total) by mouth every 8 (eight) hours as needed for nausea or vomiting. 01/27/16   Carlene CoriaSerena Y Sam, PA-C    Family History Family History  Problem Relation Age of Onset  . Diabetes Mother   . Hyperlipidemia Mother     Social History Social History  Substance Use Topics  . Smoking status: Current Every Day Smoker    Packs/day: 1.00  Types: Cigarettes  . Smokeless tobacco: Never Used  . Alcohol use 0.0 oz/week     Comment: OCC     Allergies   Patient has no known allergies.   Review of Systems Review of Systems  Constitutional: Negative for fever.  HENT: Negative for sore throat.   Eyes: Negative for visual disturbance.  Respiratory: Negative for cough and shortness of breath.   Cardiovascular: Negative for chest pain.  Gastrointestinal: Negative for abdominal pain, constipation, diarrhea, nausea and vomiting.  Genitourinary: Negative for difficulty urinating, dysuria, vaginal bleeding and vaginal discharge.  Musculoskeletal: Positive for back pain. Negative for  neck pain.  Skin: Negative for rash.  Neurological: Positive for headaches. Negative for syncope, facial asymmetry, weakness and numbness.     Physical Exam Updated Vital Signs BP 119/57 (BP Location: Right Arm)   Pulse 61   Temp 99 F (37.2 C) (Oral)   Resp 16   Ht 5\' 3"  (1.6 m)   Wt 164 lb 3.2 oz (74.5 kg)   SpO2 99%   BMI 29.09 kg/m   Physical Exam  Constitutional: She is oriented to person, place, and time. She appears well-developed and well-nourished. No distress.  HENT:  Head: Normocephalic and atraumatic.  Eyes: Conjunctivae and EOM are normal. Pupils are equal, round, and reactive to light.  Neck: Normal range of motion. Neck supple.  Cardiovascular: Normal rate, regular rhythm, normal heart sounds and intact distal pulses.  Exam reveals no gallop and no friction rub.   No murmur heard. Pulmonary/Chest: Effort normal and breath sounds normal. No respiratory distress. She has no wheezes. She has no rales.  Abdominal: Soft. She exhibits no distension. There is no tenderness. There is no rebound and no guarding.  Musculoskeletal: She exhibits no edema or tenderness.  Neurological: She is alert and oriented to person, place, and time. She has normal strength. No cranial nerve deficit or sensory deficit. She exhibits normal muscle tone. Coordination normal.  Skin: Skin is warm and dry. No rash noted. She is not diaphoretic. No erythema.  Nursing note and vitals reviewed.    ED Treatments / Results  DIAGNOSTIC STUDIES: Oxygen Saturation is 100% on RA, normal by my interpretation.    COORDINATION OF CARE: 4:16 PM Discussed treatment plan with pt at bedside and pt agreed to plan.   Labs (all labs ordered are listed, but only abnormal results are displayed) Labs Reviewed  URINALYSIS, ROUTINE W REFLEX MICROSCOPIC - Abnormal; Notable for the following:       Result Value   APPearance HAZY (*)    All other components within normal limits  POC URINE PREG, ED    EKG   EKG Interpretation None       Radiology No results found.  Procedures Procedures (including critical care time)  Medications Ordered in ED Medications - No data to display   Initial Impression / Assessment and Plan / ED Course  I have reviewed the triage vital signs and the nursing notes.  Pertinent labs & imaging results that were available during my care of the patient were reviewed by me and considered in my medical decision making (see chart for details).     26 year old female presents with concern for left lower back pain with urination and bowel movements. Urinalysis shows no sign of urinary tract infection. Pregnancy test is negative. Given symptoms only present with straining, and moderate in nature, have low suspicion for nephrolithiasis, ovarian torsion.  Doubt appendicitis, diverticulitis by hx and exam. No vaginal discharge,  doubt PID. Does not have pain or tenderness at this time.  Possible muscular strain taht began while coughing and now present with straining.  Pt with no signs of incarcerated hernia.  Also reports HA daily for one year, present while up during the day, no associated neuro symptoms, doubt acute pathology however recommend close outpatient follow up. Patient discharged in stable condition with understanding of reasons to return.   Final Clinical Impressions(s) / ED Diagnoses   Final diagnoses:  Acute left-sided low back pain without sciatica  Muscle strain    New Prescriptions New Prescriptions   CYCLOBENZAPRINE (FLEXERIL) 10 MG TABLET    Take 1 tablet (10 mg total) by mouth 2 (two) times daily as needed for muscle spasms.   I personally performed the services described in this documentation, which was scribed in my presence. The recorded information has been reviewed and is accurate.    Alvira Monday, MD 12/19/16 [phone]

## 2017-09-23 ENCOUNTER — Encounter (HOSPITAL_COMMUNITY): Payer: Self-pay | Admitting: Emergency Medicine

## 2017-09-23 ENCOUNTER — Other Ambulatory Visit: Payer: Self-pay

## 2017-09-23 ENCOUNTER — Ambulatory Visit (HOSPITAL_COMMUNITY)
Admission: EM | Admit: 2017-09-23 | Discharge: 2017-09-23 | Disposition: A | Discharge status: Home / Self Care | Payer: Self-pay | Attending: Family Medicine | Admitting: Family Medicine

## 2017-09-23 DIAGNOSIS — Z202 Contact with and (suspected) exposure to infections with a predominantly sexual mode of transmission: Secondary | ICD-10-CM | POA: Insufficient documentation

## 2017-09-23 DIAGNOSIS — Z113 Encounter for screening for infections with a predominantly sexual mode of transmission: Secondary | ICD-10-CM

## 2017-09-23 DIAGNOSIS — J45909 Unspecified asthma, uncomplicated: Secondary | ICD-10-CM | POA: Insufficient documentation

## 2017-09-23 DIAGNOSIS — D571 Sickle-cell disease without crisis: Secondary | ICD-10-CM | POA: Insufficient documentation

## 2017-09-23 DIAGNOSIS — F17210 Nicotine dependence, cigarettes, uncomplicated: Secondary | ICD-10-CM | POA: Insufficient documentation

## 2017-09-23 MED ORDER — AZITHROMYCIN 250 MG PO TABS
ORAL_TABLET | ORAL | Status: AC
  Filled 2017-09-23: qty 4

## 2017-09-23 MED ORDER — AZITHROMYCIN 250 MG PO TABS
1000.0000 mg | ORAL_TABLET | Freq: Once | ORAL | Status: AC
  Administered 2017-09-23: 1000 mg via ORAL

## 2017-09-23 NOTE — ED Triage Notes (Signed)
Patient thinks a partner may have had chlamydia.  Denies vaginal discharge.

## 2017-09-23 NOTE — Discharge Instructions (Signed)
We will have test results back in 1-3 days.  You can go online to check for results starting in the morning.

## 2017-09-23 NOTE — ED Notes (Signed)
Sent to the bathroom for a dirty specimen

## 2017-09-23 NOTE — ED Provider Notes (Signed)
[facility]   [id number] 09/23/17 Arrival Time: 1716   SUBJECTIVE:  Christina Avila is a 26 y.o. female who presents to the urgent care with complaint of  a partner that may have had chlamydia.  Denies vaginal discharge.  Girl friend, with whom she has oral sex, was recently tested positive for chlamydia.   Past Medical History:  Diagnosis Date  . Abdominal pain, other specified site   . Asthma   . Sickle cell anemia (HCC)    has trait   Family History  Problem Relation Age of Onset  . Diabetes Mother   . Hyperlipidemia Mother    Social History   Socioeconomic History  . Marital status: Single    Spouse name: Not on file  . Number of children: Not on file  . Years of education: Not on file  . Highest education level: Not on file  Social Needs  . Financial resource strain: Not on file  . Food insecurity - worry: Not on file  . Food insecurity - inability: Not on file  . Transportation needs - medical: Not on file  . Transportation needs - non-medical: Not on file  Occupational History  . Not on file  Tobacco Use  . Smoking status: Current Every Day Smoker    Packs/day: 1.00    Types: Cigarettes  . Smokeless tobacco: Never Used  Substance and Sexual Activity  . Alcohol use: Yes    Alcohol/week: 0.0 oz    Comment: OCC  . Drug use: Yes    Types: Marijuana  . Sexual activity: Not on file  Other Topics Concern  . Not on file  Social History Narrative  . Not on file   No outpatient medications have been marked as taking for the 09/23/17 encounter [facility](Hospital Encounter).   No Known Allergies    ROS: As per HPI, remainder of ROS negative.   OBJECTIVE:   Vitals:   09/23/17 1746  BP: 111/70  Pulse: 96  Resp: 18  Temp: 98.5 F (36.9 C)  TempSrc: Oral  SpO2: 99%     General appearance: alert; no distress Eyes: PERRL; EOMI; conjunctiva normal HENT: normocephalic; atraumatic; external ears normal without trauma; nasal mucosa normal; oral  mucosa normal Neck: supple Back: no CVA tenderness Extremities: no cyanosis or edema; symmetrical with no gross deformities Skin: warm and dry Neurologic: normal gait; grossly normal Psychological: alert and cooperative; normal mood and affect      Labs:  Results for orders placed or performed during the hospital encounter of 12/19/16  Urinalysis, Routine w reflex microscopic  Result Value Ref Range   Color, Urine YELLOW YELLOW   APPearance HAZY (A) CLEAR   Specific Gravity, Urine 1.025 1.005 - 1.030   pH 5.0 5.0 - 8.0   Glucose, UA NEGATIVE NEGATIVE mg/dL   Hgb urine dipstick NEGATIVE NEGATIVE   Bilirubin Urine NEGATIVE NEGATIVE   Ketones, ur NEGATIVE NEGATIVE mg/dL   Protein, ur NEGATIVE NEGATIVE mg/dL   Nitrite NEGATIVE NEGATIVE   Leukocytes, UA NEGATIVE NEGATIVE  POC Urine Pregnancy, ED ([facility] NOT [facility])  Result Value Ref Range   Preg Test, Ur NEGATIVE NEGATIVE    Labs Reviewed  URINE CYTOLOGY ANCILLARY ONLY    No results found.     ASSESSMENT & PLAN:  1. STD exposure     Meds ordered this encounter  Medications  . azithromycin (ZITHROMAX) tablet 1,000 mg    Reviewed expectations re: course of current medical issues. Questions answered. Outlined  signs and symptoms indicating need for more acute intervention. Patient verbalized understanding. After Visit Summary given.        Elvina SidleLauenstein, Aston Lawhorn, MD 09/23/17 [phone]

## 2017-09-24 LAB — URINE CYTOLOGY ANCILLARY ONLY
Chlamydia: NEGATIVE
Neisseria Gonorrhea: NEGATIVE
Trichomonas: NEGATIVE

## 2017-09-26 LAB — URINE CYTOLOGY ANCILLARY ONLY
Bacterial vaginitis: NEGATIVE
Candida vaginitis: NEGATIVE

## 2017-12-04 ENCOUNTER — Emergency Department (HOSPITAL_COMMUNITY)
Admission: EM | Admit: 2017-12-04 | Discharge: 2017-12-04 | Disposition: A | Discharge status: Home / Self Care | Payer: Self-pay | Attending: Emergency Medicine | Admitting: Emergency Medicine

## 2017-12-04 ENCOUNTER — Encounter (HOSPITAL_COMMUNITY): Payer: Self-pay

## 2017-12-04 ENCOUNTER — Other Ambulatory Visit: Payer: Self-pay

## 2017-12-04 ENCOUNTER — Emergency Department (HOSPITAL_COMMUNITY): Payer: Self-pay

## 2017-12-04 DIAGNOSIS — S62317A Displaced fracture of base of fifth metacarpal bone. left hand, initial encounter for closed fracture: Secondary | ICD-10-CM | POA: Insufficient documentation

## 2017-12-04 DIAGNOSIS — W2209XA Striking against other stationary object, initial encounter: Secondary | ICD-10-CM | POA: Insufficient documentation

## 2017-12-04 DIAGNOSIS — S62339A Displaced fracture of neck of unspecified metacarpal bone, initial encounter for closed fracture: Secondary | ICD-10-CM

## 2017-12-04 DIAGNOSIS — J45909 Unspecified asthma, uncomplicated: Secondary | ICD-10-CM | POA: Insufficient documentation

## 2017-12-04 DIAGNOSIS — Y929 Unspecified place or not applicable: Secondary | ICD-10-CM | POA: Insufficient documentation

## 2017-12-04 DIAGNOSIS — Y9389 Activity, other specified: Secondary | ICD-10-CM | POA: Insufficient documentation

## 2017-12-04 DIAGNOSIS — Y999 Unspecified external cause status: Secondary | ICD-10-CM | POA: Insufficient documentation

## 2017-12-04 DIAGNOSIS — F17290 Nicotine dependence, other tobacco product, uncomplicated: Secondary | ICD-10-CM | POA: Insufficient documentation

## 2017-12-04 MED ORDER — TRAMADOL HCL 50 MG PO TABS
50.0000 mg | ORAL_TABLET | Freq: Once | ORAL | Status: AC
  Administered 2017-12-04: 50 mg via ORAL
  Filled 2017-12-04: qty 1

## 2017-12-04 MED ORDER — TRAMADOL HCL 50 MG PO TABS: 50.0000 mg | ORAL_TABLET | Freq: Four times a day (QID) | ORAL | 0 refills | Status: DC | PRN

## 2017-12-04 NOTE — ED Triage Notes (Addendum)
Patient states she punched a wall out of anger is now having left hand pain and swelling.

## 2017-12-04 NOTE — Discharge Instructions (Signed)
You were seen here today for hand pain. You were found to have a fracture to the distal fifth metacarpal.   Please call as soon as possible to schedule follow up with hand surgeon.  Follow attached handouts.   For pain control you may take: 800mg  of ibuprofen (that is usually four 200mg  over the counter pills) up to 3 times a day (please take with food) and acetaminophen 975mg  (this is 3 normal strength, 325mg , over the counter pills) up to four times a day. Please do not take more than this. Do not drink alcohol or combine with other medications that have acetaminophen or Ibuprofen as an ingredient (Read the labels!).    For breakthrough pain you may take Ultram. Do not drink alcohol drive or operate heavy machinery when taking. You are being provided a prescription for opiates (also known as narcotics) for pain control on an ?as needed? basis.  Opiates can be addictive and should only be used when absolutely necessary for pain control when other alternatives do not work.  We recommend you only use them for the recommended amount of time and only as prescribed.  Please do not take with other sedative medications or alcohol.  Please do not drive, operate machinery, or make important decisions while taking opiates.  Please note that these medications can be addictive and have high abuse potential.  Please keep these medications locked away from children, teenagers or any family members with history of substance abuse. Additionally, these medications may cause constipation - take over the counter stool softeners or add fiber to your diet to treat this (Metamucil, Psyllium Fiber, Colace, Miralax) Further refills will need to be obtained from your primary care doctor and will not be prescribed through the Emergency Department. You will test positive on most drug tests while taking this medication.

## 2017-12-04 NOTE — ED Notes (Signed)
Bed: [facility] Expected date:  Expected time:  Means of arrival:  Comments: 

## 2017-12-04 NOTE — ED Provider Notes (Signed)
[facility] Note   CSN: [id number] Arrival date & time: 12/04/17  [id number]     History   Chief Complaint Chief Complaint  Patient presents with  . Hand Injury    HPI Christina Avila is a 27 y.o. female who presents emerged department today for left hand pain.  Patient states approximately 3-4 days ago she became angry and punched a wall with her left hand.  Since then she has been having pain, swelling over the fourth and fifth metacarpals.  She classifies her pain as throbbing and is worsened with movement of the fingers.  She has been taking Tylenol for this with minimal relief.  She denies any associated numbness or tingling, open wounds or fevers.    HPI  Past Medical History:  Diagnosis Date  . Abdominal pain, other specified site   . Asthma   . 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 Hernia     Past Surgical History:  Procedure Laterality Date  . HERNIA REPAIR     umbilical at age 27  . UMBILICAL HERNIA REPAIR      OB History    No data available       Home Medications    Prior to Admission medications   Medication Sig Start Date End Date Taking? Authorizing Provider  acetaminophen (TYLENOL) 500 MG tablet Take 1,000 mg by mouth every 6 (six) hours as needed. For pain/fever    [provider]  albuterol (PROVENTIL HFA;VENTOLIN HFA) 108 (90 BASE) MCG/ACT inhaler Inhale 2 puffs into the lungs every 6 (six) hours as needed for wheezing or shortness of breath.    [provider]  fluticasone (FLONASE) 50 MCG/ACT nasal spray Place 2 sprays into both nostrils daily. 05/31/16   Eyvonne MechanicHedges, Jeffrey, PA-C    Family History Family History  Problem Relation Age of Onset  . Diabetes Mother   . Hyperlipidemia Mother     Social History Social History   Tobacco Use  . Smoking status: Current Every Day Smoker    Packs/day: 1.00    Types: Cigars  .  Smokeless tobacco: Never Used  Substance Use Topics  . Alcohol use: Yes    Alcohol/week: 0.0 oz    Comment: OCC  . Drug use: No    Comment: former user     Allergies   Patient has no known allergies.   Review of Systems Review of Systems  Constitutional: Negative for fever.  Musculoskeletal: Positive for arthralgias and joint swelling.  Skin: Negative for wound.  Neurological: Negative for weakness and numbness.  All other systems reviewed and are negative.    Physical Exam Updated Vital Signs BP 106/76 (BP Location: Left Arm)   Temp 98.1 F (36.7 C) (Oral)   Resp 16   Ht 5\' 3"  (1.6 m)   Wt 72.6 kg (160 lb)   LMP 12/03/2017   SpO2 93%   BMI 28.34 kg/m   Physical Exam  Constitutional: She appears well-developed and well-nourished.  HENT:  Head: Normocephalic and atraumatic.  Right Ear: External ear normal.  Left Ear: External ear normal.  Eyes: Conjunctivae are normal. Right eye exhibits no discharge. Left eye exhibits no discharge. No scleral icterus.  Cardiovascular:  Pulses:      Radial pulses are 2+ on the left side.  Pulmonary/Chest: Effort normal. No respiratory distress.  Musculoskeletal:  Left hand: Skin intact. Patient  with swelling over the fourth and fifth metacarpals.  Fifth metacarpal appears to be "missing".  There is tenderness palpation over the fifth MCP.  No tenderness to palpation of the hand otherwise.  Fifth digit strength for resisted flexion/extension at the MCP, PIP and DIP slightly decreased 2/2 to pain but intact. FDS/FDP intact.  No scissoring of for the fifth digit on exam.  Radial artery 2+ with <2sec cap refill. SILT in M/U/R distributions.  Neurological: She is alert. She has normal strength. No sensory deficit.  Skin: Skin is warm, dry and intact. No abrasion and no laceration noted. No pallor.  Psychiatric: She has a normal mood and affect.  Nursing note and vitals reviewed.    ED Treatments / Results  Labs (all labs ordered  are listed, but only abnormal results are displayed) Labs Reviewed - No data to display  EKG  EKG Interpretation None       Radiology Dg Hand Complete Left  Result Date: 12/04/2017 CLINICAL DATA:  Patient struck a wall 3-4 days and has persistent pain and swelling over the fourth and fifth digits. EXAM: LEFT HAND - COMPLETE 3+ VIEW COMPARISON:  None in PACs FINDINGS: There is an acute comminuted angulated fracture involving the junction of the shaft and head of the fifth metacarpal. The first through fourth metacarpals are intact. The phalanges are intact as are the carpal bones. IMPRESSION: There is an acute comminuted, angulated fracture of the distal aspect of the fifth metacarpal. Electronically Signed   By: David  [place] M.D.   On: 12/04/2017 10:01    Procedures Procedures (including critical care time) SPLINT APPLICATION Date/Time: 11:57 AM Authorized by: Jacinto Halim Consent: Verbal consent obtained. Risks and benefits: risks, benefits and alternatives were discussed Consent given by: patient Splint applied by: orthopedic technician Location details: left hand Splint type: ulnar gutter splint Supplies used: ulnar gutter splint Post-procedure: The splinted body part was neurovascularly unchanged following the procedure. Patient tolerance: Patient tolerated the procedure well with no immediate complications.    Medications Ordered in ED Medications  traMADol (ULTRAM) tablet 50 mg (not administered)     Initial Impression / Assessment and Plan / ED Course  I have reviewed the triage vital signs and the nursing notes.  Pertinent labs & imaging results that were available during my care of the patient were reviewed by me and considered in my medical decision making (see chart for details).     27 y.o. female diagnosed with fifth metacarpal boxer's fracture after punching a wall approximately 3 days ago.  Skin is intact.  This is a closed fracture.  She is  neurovascularly intact.  No evidence of tendon damage.  There is no scissoring on exam.  Pain controlled in the emergency department.  Will place the patient in a ulnar gutter splint and give referral for hand surgeon.  Will provide short course of pain medication and recommend conservative therapies.  Advised proper splint care.  Patient given strict return precautions.  She appears safe for discharge.  Final Clinical Impressions(s) / ED Diagnoses   Final diagnoses:  Closed boxer's fracture, initial encounter    ED Discharge Orders        Ordered    traMADol (ULTRAM) 50 MG tablet  Every 6 hours PRN     12/04/17 1157       Princella Pellegrini 12/04/17 1642    Samuel Jester, DO 12/05/17 1601

## 2018-01-01 ENCOUNTER — Encounter: Payer: Self-pay | Admitting: Internal Medicine

## 2018-01-01 ENCOUNTER — Ambulatory Visit: Payer: Self-pay | Attending: Internal Medicine | Admitting: Internal Medicine

## 2018-01-01 ENCOUNTER — Other Ambulatory Visit: Payer: Self-pay

## 2018-01-01 VITALS — BP 113/74 | HR 100 | Temp 98.2°F | Resp 12 | Ht 63.0 in | Wt 166.2 lb

## 2018-01-01 DIAGNOSIS — F17290 Nicotine dependence, other tobacco product, uncomplicated: Secondary | ICD-10-CM | POA: Insufficient documentation

## 2018-01-01 DIAGNOSIS — Z833 Family history of diabetes mellitus: Secondary | ICD-10-CM | POA: Insufficient documentation

## 2018-01-01 DIAGNOSIS — W2201XA Walked into wall, initial encounter: Secondary | ICD-10-CM | POA: Insufficient documentation

## 2018-01-01 DIAGNOSIS — S62307A Unspecified fracture of fifth metacarpal bone, left hand, initial encounter for closed fracture: Secondary | ICD-10-CM | POA: Insufficient documentation

## 2018-01-01 NOTE — Progress Notes (Signed)
Patient ID: Christina Avila, female    DOB: [birth year]  MRN: [id number]  CC: Follow-up (hand fx)   Subjective: Christina Avila is a 27 y.o. female who presents for new pt visit and f/u ER. Her concerns today include:  Pt seen in ER 12/04/2017 for LT hand pain due to self-inflicted hand injury 3-4 days prior.  She was angry and had punched the wall.  She was found to have a boxer's fracture.  Patient hand placed in splint and given tramadol.  Told to follow-up with hand surgeon.  No insurance.  She got forms today from front desk and plans to fill out.   Patient Active Problem List   Diagnosis Date Noted  . Hernia      Current Outpatient Medications on File Prior to Visit  Medication Sig Dispense Refill  . acetaminophen (TYLENOL) 500 MG tablet Take 1,000 mg by mouth every 6 (six) hours as needed. For pain/fever    . fluticasone (FLONASE) 50 MCG/ACT nasal spray Place 2 sprays into both nostrils daily. 9.9 g 2  . ibuprofen (ADVIL,MOTRIN) 200 MG tablet Take 200 mg by mouth daily as needed (PAIN).    Marland Kitchen. traMADol (ULTRAM) 50 MG tablet Take 1 tablet (50 mg total) by mouth every 6 (six) hours as needed. 12 tablet 0   No current facility-administered medications on file prior to visit.     No Known Allergies  Social History   Socioeconomic History  . Marital status: Single    Spouse name: Not on file  . Number of children: Not on file  . Years of education: Not on file  . Highest education level: Not on file  Social Needs  . Financial resource strain: Not on file  . Food insecurity - worry: Not on file  . Food insecurity - inability: Not on file  . Transportation needs - medical: Not on file  . Transportation needs - non-medical: Not on file  Occupational History  . Not on file  Tobacco Use  . Smoking status: Current Every Day Smoker    Packs/day: 1.00    Types: Cigars  . Smokeless tobacco: Never Used  Substance and Sexual Activity  . Alcohol use: Yes    Alcohol/week: 0.0  oz    Comment: OCC  . Drug use: No    Comment: former user  . Sexual activity: Not on file  Other Topics Concern  . Not on file  Social History Narrative  . Not on file    Family History  Problem Relation Age of Onset  . Diabetes Mother   . Hyperlipidemia Mother     Past Surgical History:  Procedure Laterality Date  . HERNIA REPAIR     umbilical at age 27  . UMBILICAL HERNIA REPAIR      ROS: Review of Systems Neg except as above PHYSICAL EXAM: BP 113/74 (BP Location: Right Arm, Patient Position: Sitting, Cuff Size: Normal)   Pulse 100   Temp 98.2 F (36.8 C) (Oral)   Resp 12   Ht 5\' 3"  (1.6 m)   Wt 166 lb 3.2 oz (75.4 kg)   LMP 12/29/2017 (Exact Date)   SpO2 97%   BMI 29.44 kg/m   Physical Exam  General appearance - alert, well appearing, and in no distress Mental status - alert, oriented to person, place, and time, normal mood, behavior, speech, dress, motor activity, and thought processes Musculoskeletal -Splint removed from LT forearm and hand.  Pt has good movement of 1st-4th  fingers.  5th finger has moderate tenderness over MCP jt with restricted movement.  Hard splint with ace wraps reapplied  ASSESSMENT AND PLAN: 1. Closed nondisplaced fracture of fifth metacarpal bone of left hand, unspecified portion of metacarpal, initial encounter -pt needs to see ortho and does not have insurance.  Advise to complete form for OC ASAP.  In mean time, we may have to send her to [facility]. Message sent to referral specialist.  - Ambulatory referral to Orthopedic Surgery  Patient was given the opportunity to ask questions.  Patient verbalized understanding of the plan and was able to repeat key elements of the plan.   Orders Placed This Encounter  Procedures  . Ambulatory referral to Orthopedic Surgery     Requested Prescriptions    No prescriptions requested or ordered in this encounter    No Follow-up on file.  Christina Blue, MD, FACP

## 2018-01-01 NOTE — Patient Instructions (Signed)
I have submitted a referral for you to see the orthopedic specialist.  Our Referral person will schedule this for you. In the mean time, continue wearing splint for another 2-3 weeks.

## 2018-01-01 NOTE — Progress Notes (Signed)
F/u right hand fracture

## 2018-01-24 ENCOUNTER — Ambulatory Visit: Payer: Self-pay | Attending: Internal Medicine | Admitting: Physician Assistant

## 2018-01-24 VITALS — BP 105/68 | HR 77 | Temp 98.8°F | Wt 163.6 lb

## 2018-01-24 DIAGNOSIS — X58XXXD Exposure to other specified factors, subsequent encounter: Secondary | ICD-10-CM | POA: Insufficient documentation

## 2018-01-24 DIAGNOSIS — S62339G Displaced fracture of neck of unspecified metacarpal bone, subsequent encounter for fracture with delayed healing: Secondary | ICD-10-CM | POA: Insufficient documentation

## 2018-01-24 DIAGNOSIS — D571 Sickle-cell disease without crisis: Secondary | ICD-10-CM | POA: Insufficient documentation

## 2018-01-24 MED ORDER — IBUPROFEN 600 MG PO TABS: 600.0000 mg | ORAL_TABLET | Freq: Three times a day (TID) | ORAL | 0 refills | Status: DC | PRN

## 2018-01-24 MED FILL — IBUPROFEN 600 MG TABLET: 600 | 10 days supply | Qty: 30 | Fill #0

## 2018-01-24 NOTE — Progress Notes (Signed)
Patient ID: Christina Avila, female   DOB: 11/14/1990, 27 y.o.   MRN: [id number]   Christina Avila, is a 27 y.o. female  WUJ:[id number]  [id number]  DOB - [birth year]  Subjective:  Chief Complaint and HPI: Christina Avila is a 27 y.o. female here today  Not having had follow-up from her boxer's fracture.  Still in original splint.  Hasn't seen ortho.  Hasnt made appt for orange card/financial aid as instructed. Still having some pain and discomfort.   ROS:   Constitutional:  No f/c, No night sweats, No unexplained weight loss. EENT:  No vision changes, No blurry vision, No hearing changes. No mouth, throat, or ear problems.  Respiratory: No cough, No SOB Cardiac: No CP, no palpitations GI:  No abd pain, No N/V/D. GU: No Urinary s/sx Musculoskeletal: No joint pain Neuro: No headache, no dizziness, no motor weakness.  Skin: No rash Endocrine:  No polydipsia. No polyuria.  Psych: Denies SI/HI  No problems updated.  ALLERGIES: No Known Allergies  PAST MEDICAL HISTORY: Past Medical History:  Diagnosis Date  . Abdominal pain, other specified site   . Asthma   . Sickle cell anemia (HCC)    has trait    MEDICATIONS AT HOME: Prior to Admission medications   Medication Sig Start Date End Date Taking? Authorizing Provider  acetaminophen (TYLENOL) 500 MG tablet Take 1,000 mg by mouth every 6 (six) hours as needed. For pain/fever    [provider]  fluticasone (FLONASE) 50 MCG/ACT nasal spray Place 2 sprays into both nostrils daily. Patient not taking: Reported on 01/24/2018 05/31/16   Hedges, Tinnie Gens, PA-C  ibuprofen (ADVIL,MOTRIN) 600 MG tablet Take 1 tablet (600 mg total) by mouth every 8 (eight) hours as needed. 01/24/18   Anders Simmonds, PA-C  traMADol (ULTRAM) 50 MG tablet Take 1 tablet (50 mg total) by mouth every 6 (six) hours as needed. Patient not taking: Reported on 01/24/2018 12/04/17   Jacinto Halim, PA-C     Objective:  EXAM:   Vitals:   01/24/18 1343  BP: 105/68  Pulse: 77  Temp: 98.8 F (37.1 C)  TempSrc: Oral  SpO2: 97%  Weight: 163 lb 9.6 oz (74.2 kg)    General appearance : A&OX3. NAD. Non-toxic-appearing HEENT: Atraumatic and Normocephalic.  PERRLA. EOM intact.   Neck: supple, no JVD. No cervical lymphadenopathy. No thyromegaly Chest/Lungs:  Breathing-non-labored, Good air entry bilaterally, breath sounds normal without rales, rhonchi, or wheezing  CVS: S1 S2 regular, no murmurs, gallops, rubs  Extremities: Bilateral Lower Ext shows no edema, both legs are warm to touch with = pulse throughout.  L arm/hand in splint.  No gross swelling.  N-V intact.  Neurology:  CN II-XII grossly intact, Non focal.   Psych:  TP linear. J/I WNL. Normal speech. Appropriate eye contact and affect.  Skin:  No Rash  Data Review No results found for: HGBA1C   Assessment & Plan   1. Closed boxer's fracture with delayed healing, subsequent encounter She can opt to go to UC if desired for new splint or she can wait until Monday.  Concern for delayed healing.  No sign of compartment syndrome.   Ortho appt made for 3:30 Monday.  Elevate and ice as needed.   - ibuprofen (ADVIL,MOTRIN) 600 MG tablet; Take 1 tablet (600 mg total) by mouth every 8 (eight) hours as needed.  Dispense: 30 tablet; Refill: 0 - DG Hand 2 View Left; Future     Patient have been counseled extensively  about nutrition and exercise  Return if symptoms worsen or fail to improve.  The patient was given clear instructions to go to ER or return to medical center if symptoms don't improve, worsen or new problems develop. The patient verbalized understanding. The patient was told to call to get lab results if they haven't heard anything in the next week.     Georgian CoAngela Tomorrow Dehaas, PA-C [facility] [place], [place] [phone]   01/24/2018, 1:59 PM  [phone] Catrina Battle(mom)

## 2018-01-24 NOTE — Progress Notes (Signed)
Pt. Is here for left hand pain. Stated been feeling achy and swelling in the cast.

## 2018-01-28 ENCOUNTER — Ambulatory Visit (INDEPENDENT_AMBULATORY_CARE_PROVIDER_SITE_OTHER): Payer: Self-pay | Admitting: Orthopaedic Surgery

## 2018-02-04 ENCOUNTER — Encounter (INDEPENDENT_AMBULATORY_CARE_PROVIDER_SITE_OTHER): Payer: Self-pay | Admitting: Orthopaedic Surgery

## 2018-02-04 ENCOUNTER — Ambulatory Visit (INDEPENDENT_AMBULATORY_CARE_PROVIDER_SITE_OTHER): Payer: Self-pay

## 2018-02-04 ENCOUNTER — Ambulatory Visit (INDEPENDENT_AMBULATORY_CARE_PROVIDER_SITE_OTHER): Payer: Self-pay | Admitting: Orthopaedic Surgery

## 2018-02-04 DIAGNOSIS — S62339A Displaced fracture of neck of unspecified metacarpal bone, initial encounter for closed fracture: Secondary | ICD-10-CM

## 2018-02-04 NOTE — Progress Notes (Signed)
Office Visit Note   Patient: Christina Avila           Date of Birth: [birth year]           MRN: [id number] Visit Date: 02/04/2018              Requested by: Marcine Matar, MD [address] [place], [place] [address] PCP: Marcine Matar, MD   Assessment & Plan: Visit Diagnoses:  1. Closed boxer's fracture, initial encounter     Plan: At this point, her fracture is healed and there is nothing surgically that can be done.  She is very stiff however so we need to send her to outpatient hand therapy to get her motion back.  We we have given her a prescription for this.  She will follow-up with [place] in 6 weeks time for recheck.  She will call with concerns or questions in the meantime.  Follow-Up Instructions: Return in about 6 weeks (around 03/18/2018).   Orders:  Orders Placed This Encounter  Procedures  . XR Hand Complete Left   No orders of the defined types were placed in this encounter.     Procedures: No procedures performed   Clinical Data: No additional findings.   Subjective: Chief Complaint  Patient presents with  . Left Hand - Pain    HPI patient is a pleasant 27 year old female who presents our clinic today for follow-up of her left hand.  On 12/01/2017 she punched a wall.  She was seen in the ED on 12/04/2017 where x-rays were obtained.  This showed a boxer's fracture.  She was placed in a splint.  She followed up with Dr. Laural Benes on 01/01/2018.  She was placed back in a cast.  She has been in his cast since following up with [place] today.  She notes minimal pain but more of an achiness to the fifth metacarpal.  Very limited motion.  No numbness tingling burning.  Review of Systems as detailed in HPI.  All others reviewed and are negative.   Objective: Vital Signs: There were no vitals taken for this visit.  Physical Exam well-developed well-nourished female in no acute distress.  Alert and oriented x3.  Ortho Exam examination of the left hand reveals  minimal tenderness over the fifth metacarpal.  Very limited flexion of the ring and small fingers.  Unable to assess rotation due to limited flexion.  No swelling to the hand.  She is neurovascularly intact distally.  Specialty Comments:  No specialty comments available.  Imaging: Xr Hand Complete Left  Result Date: 02/04/2018 X-rays of the left hand reveals a healed fifth metacarpal neck fracture with about 54 degrees of angulation.    PMFS History: Patient Active Problem List   Diagnosis Date Noted  . Boxer's metacarpal fracture, neck, closed 02/04/2018  . Hernia    Past Medical History:  Diagnosis Date  . Abdominal pain, other specified site   . Asthma   . Sickle cell anemia (HCC)    has trait    Family History  Problem Relation Age of Onset  . Diabetes Mother   . Hyperlipidemia Mother     Past Surgical History:  Procedure Laterality Date  . HERNIA REPAIR     umbilical at age 52  . UMBILICAL HERNIA REPAIR     Social History   Occupational History  . Not on file  Tobacco Use  . Smoking status: Current Every Day Smoker    Packs/day: 1.00  Types: Cigars  . Smokeless tobacco: Never Used  Substance and Sexual Activity  . Alcohol use: Yes    Alcohol/week: 0.0 oz    Comment: OCC  . Drug use: No    Types: Marijuana    Comment: former user  . Sexual activity: Not on file

## 2018-02-08 ENCOUNTER — Other Ambulatory Visit (INDEPENDENT_AMBULATORY_CARE_PROVIDER_SITE_OTHER): Payer: Self-pay

## 2018-02-08 DIAGNOSIS — S62339A Displaced fracture of neck of unspecified metacarpal bone, initial encounter for closed fracture: Secondary | ICD-10-CM

## 2018-02-13 ENCOUNTER — Other Ambulatory Visit (INDEPENDENT_AMBULATORY_CARE_PROVIDER_SITE_OTHER): Payer: Self-pay

## 2018-02-13 DIAGNOSIS — S62339A Displaced fracture of neck of unspecified metacarpal bone, initial encounter for closed fracture: Secondary | ICD-10-CM

## 2018-12-10 ENCOUNTER — Emergency Department (HOSPITAL_COMMUNITY)
Admission: EM | Admit: 2018-12-10 | Discharge: 2018-12-10 | Disposition: A | Discharge status: Home / Self Care | Payer: Self-pay | Attending: Emergency Medicine | Admitting: Emergency Medicine

## 2018-12-10 ENCOUNTER — Other Ambulatory Visit: Payer: Self-pay

## 2018-12-10 ENCOUNTER — Emergency Department (HOSPITAL_COMMUNITY): Payer: Self-pay

## 2018-12-10 ENCOUNTER — Encounter (HOSPITAL_COMMUNITY): Payer: Self-pay | Admitting: Emergency Medicine

## 2018-12-10 DIAGNOSIS — R6889 Other general symptoms and signs: Secondary | ICD-10-CM

## 2018-12-10 DIAGNOSIS — F17290 Nicotine dependence, other tobacco product, uncomplicated: Secondary | ICD-10-CM | POA: Insufficient documentation

## 2018-12-10 DIAGNOSIS — J111 Influenza due to unidentified influenza virus with other respiratory manifestations: Secondary | ICD-10-CM | POA: Insufficient documentation

## 2018-12-10 DIAGNOSIS — J45909 Unspecified asthma, uncomplicated: Secondary | ICD-10-CM | POA: Insufficient documentation

## 2018-12-10 MED ORDER — BENZONATATE 100 MG PO CAPS: 100.0000 mg | ORAL_CAPSULE | Freq: Three times a day (TID) | ORAL | 0 refills | Status: DC

## 2018-12-10 MED ORDER — ACETAMINOPHEN 325 MG PO TABS
650.0000 mg | ORAL_TABLET | Freq: Once | ORAL | Status: AC | PRN
  Administered 2018-12-10: 650 mg via ORAL
  Filled 2018-12-10: qty 2

## 2018-12-10 NOTE — ED Triage Notes (Signed)
Patient c/o cough, congestion, body aches and fevers since last night.

## 2018-12-10 NOTE — ED Notes (Signed)
Pt assessed and discharged by [facility]

## 2018-12-10 NOTE — ED Provider Notes (Signed)
[facility]-EMERGENCY DEPT Provider Note   CSN: [id number] Arrival date & time: 12/10/18  1747     History   Chief Complaint Chief Complaint  Patient presents with  . Cough  . Nasal Congestion    HPI Christina Avila is a 28 y.o. female.  HPI   28 year old female, daily smoker, presents with a 1 day history of cough, and nasal congestion.  She states symptoms started suddenly last night with a cough.  She denies any known sick exposures.  She denies sputum production, sore throat, ear pain.  She denies any nausea, vomiting, abdominal pain, chest pain, shortness of breath.  Denies any neck pain, neck stiffness, rashes.  Past Medical History:  Diagnosis Date  . Abdominal pain, other specified site   . Asthma   . 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 Boxer's metacarpal fracture, neck, closed 02/04/2018  . Hernia     Past Surgical History:  Procedure Laterality Date  . HERNIA REPAIR     umbilical at age 28  . UMBILICAL HERNIA REPAIR       OB History   No obstetric history on file.      Home Medications    Prior to Admission medications   Medication Sig Start Date End Date Taking? Authorizing Provider  acetaminophen (TYLENOL) 500 MG tablet Take 1,000 mg by mouth every 6 (six) hours as needed. For pain/fever    [provider]  fluticasone (FLONASE) 50 MCG/ACT nasal spray Place 2 sprays into both nostrils daily. Patient not taking: Reported on 01/24/2018 05/31/16   Hedges, Tinnie GensJeffrey, PA-C  ibuprofen (ADVIL,MOTRIN) 600 MG tablet Take 1 tablet (600 mg total) by mouth every 8 (eight) hours as needed. 01/24/18   Anders SimmondsMcClung, Angela M, PA-C  traMADol (ULTRAM) 50 MG tablet Take 1 tablet (50 mg total) by mouth every 6 (six) hours as needed. Patient not taking: Reported on 01/24/2018 12/04/17   Jacinto HalimMaczis, Michael M, PA-C    Family History Family History  Problem Relation Age of Onset  .  Diabetes Mother   . Hyperlipidemia Mother     Social History Social History   Tobacco Use  . Smoking status: Current Every Day Smoker    Packs/day: 1.00    Types: Cigars  . Smokeless tobacco: Never Used  Substance Use Topics  . Alcohol use: Yes    Comment: OCC  . Drug use: No    Types: Marijuana    Comment: former user     Allergies   Patient has no known allergies.   Review of Systems Review of Systems  Constitutional: Positive for chills and fever.  HENT: Positive for rhinorrhea. Negative for ear pain and sore throat.   Respiratory: Positive for cough. Negative for shortness of breath.   Cardiovascular: Negative for chest pain.  Gastrointestinal: Negative for abdominal pain, nausea and vomiting.     Physical Exam Updated Vital Signs BP 129/80   Pulse (!) 108   Temp 98 F (36.7 C) (Oral)   Resp 20   LMP 11/09/2018   SpO2 98%   Physical Exam Vitals signs and nursing note reviewed.  Constitutional:      Appearance: She is well-developed.  HENT:     Head: Normocephalic and atraumatic.     Right Ear: Tympanic membrane normal.     Left Ear: Tympanic membrane normal.     Nose: Congestion and rhinorrhea present.  Rhinorrhea is clear.     Mouth/Throat:     Mouth: Mucous membranes are moist.     Palate: No mass.     Pharynx: Oropharynx is clear. Uvula midline.  Eyes:     Conjunctiva/sclera: Conjunctivae normal.  Neck:     Musculoskeletal: Neck supple.  Cardiovascular:     Rate and Rhythm: Normal rate and regular rhythm.     Heart sounds: Normal heart sounds. No murmur.  Pulmonary:     Effort: Pulmonary effort is normal. No respiratory distress.     Breath sounds: Normal breath sounds. No wheezing or rales.  Abdominal:     General: Bowel sounds are normal. There is no distension.     Palpations: Abdomen is soft.     Tenderness: There is no abdominal tenderness.  Musculoskeletal: Normal range of motion.        General: No tenderness or deformity.    Lymphadenopathy:     Cervical: No cervical adenopathy.  Skin:    General: Skin is warm and dry.     Findings: No erythema or rash.  Neurological:     Mental Status: She is alert and oriented to person, place, and time.  Psychiatric:        Behavior: Behavior normal.      ED Treatments / Results  Labs (all labs ordered are listed, but only abnormal results are displayed) Labs Reviewed - No data to display  EKG None  Radiology Dg Chest 2 View  Result Date: 12/10/2018 CLINICAL DATA:  Cough, congestion, body aches, and fever since last night. Current smoker. Shortness of breath. EXAM: CHEST - 2 VIEW COMPARISON:  11/06/2010 FINDINGS: The heart size and mediastinal contours are within normal limits. Both lungs are clear. The visualized skeletal structures are unremarkable. IMPRESSION: No active cardiopulmonary disease. Electronically Signed   By: Burman NievesWilliam  Stevens M.D.   On: 12/10/2018 19:29    Procedures Procedures (including critical care time)  Medications Ordered in ED Medications  acetaminophen (TYLENOL) tablet 650 mg (650 mg Oral Given 12/10/18 1812)     Initial Impression / Assessment and Plan / ED Course  I have reviewed the triage vital signs and the nursing notes.  Pertinent labs & imaging results that were available during my care of the patient were reviewed by me and considered in my medical decision making (see chart for details).     Patient presents with a 1 day history of cough, fevers, nasal congestion.  Patient symptoms are consistent with a flulike illness.  Chest x-ray shows no pneumonia, pneumothorax, pleural effusion.  She has no increased work of breathing, accessory muscle use.  Her vital signs are improving, fever is resolving with Tylenol.  Discussed risk, benefits and alternatives to Tamiflu at this time.  After discussion, patient declined Tamiflu.  Courage symptom management.  Given strict return precautions.  She expressed understanding and is  agreeable with plan.  Ready and stable for discharge.   At this time there does not appear to be any evidence of an acute emergency medical condition and the patient appears stable for discharge with appropriate outpatient follow up.Diagnosis was discussed with patient who verbalizes understanding and is agreeable to discharge.  Final Clinical Impressions(s) / ED Diagnoses   Final diagnoses:  None    ED Discharge Orders    None       Rueben BashKendrick, Gilberto Stanforth S, PA-C 12/10/18 2109    Marily MemosMesner, Jason, MD 12/10/18 2344

## 2018-12-10 NOTE — Discharge Instructions (Signed)
Take Tylenol or Motrin as needed for body aches and fever.  Drink plenty of fluids.  Take Tessalon Perles as needed for cough.  Rest.  Follow-up with your primary care provider in 2 days for continued valuation.  Return to the ED immediately for new or worsening symptoms or concerns, such as neck pain, neck stiffness, rashes, shortness of breath, vomiting or any concerns at all.

## 2019-02-04 IMAGING — CR DG CHEST 2V
2 series · 2 of 2 positions shown · non-contrast
Comparison: 11/06/2010

CLINICAL DATA: Cough, congestion, body aches, and fever since last
night. Current smoker. Shortness of breath.

EXAM:
CHEST - 2 VIEW

[w chest pa]
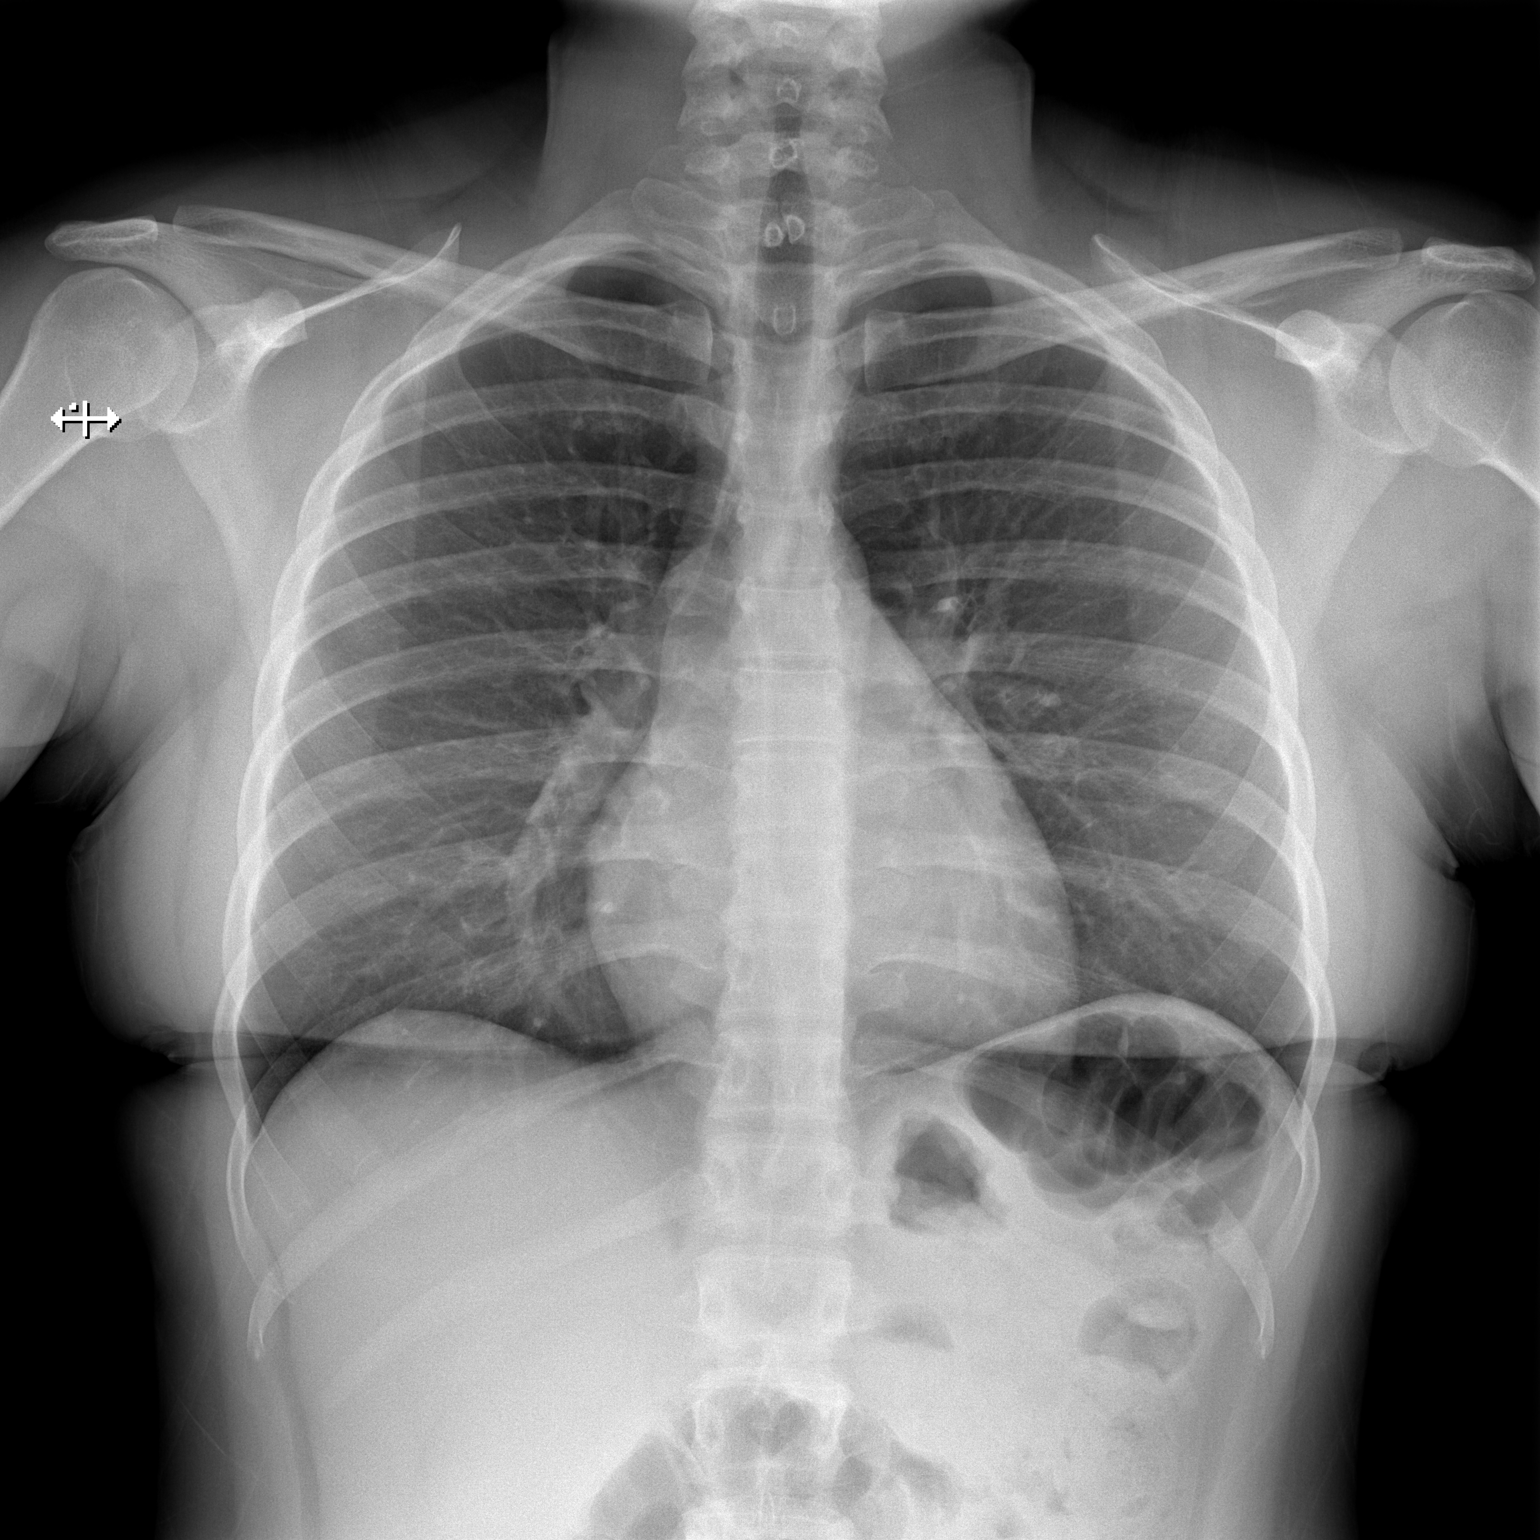

[w chest lat]
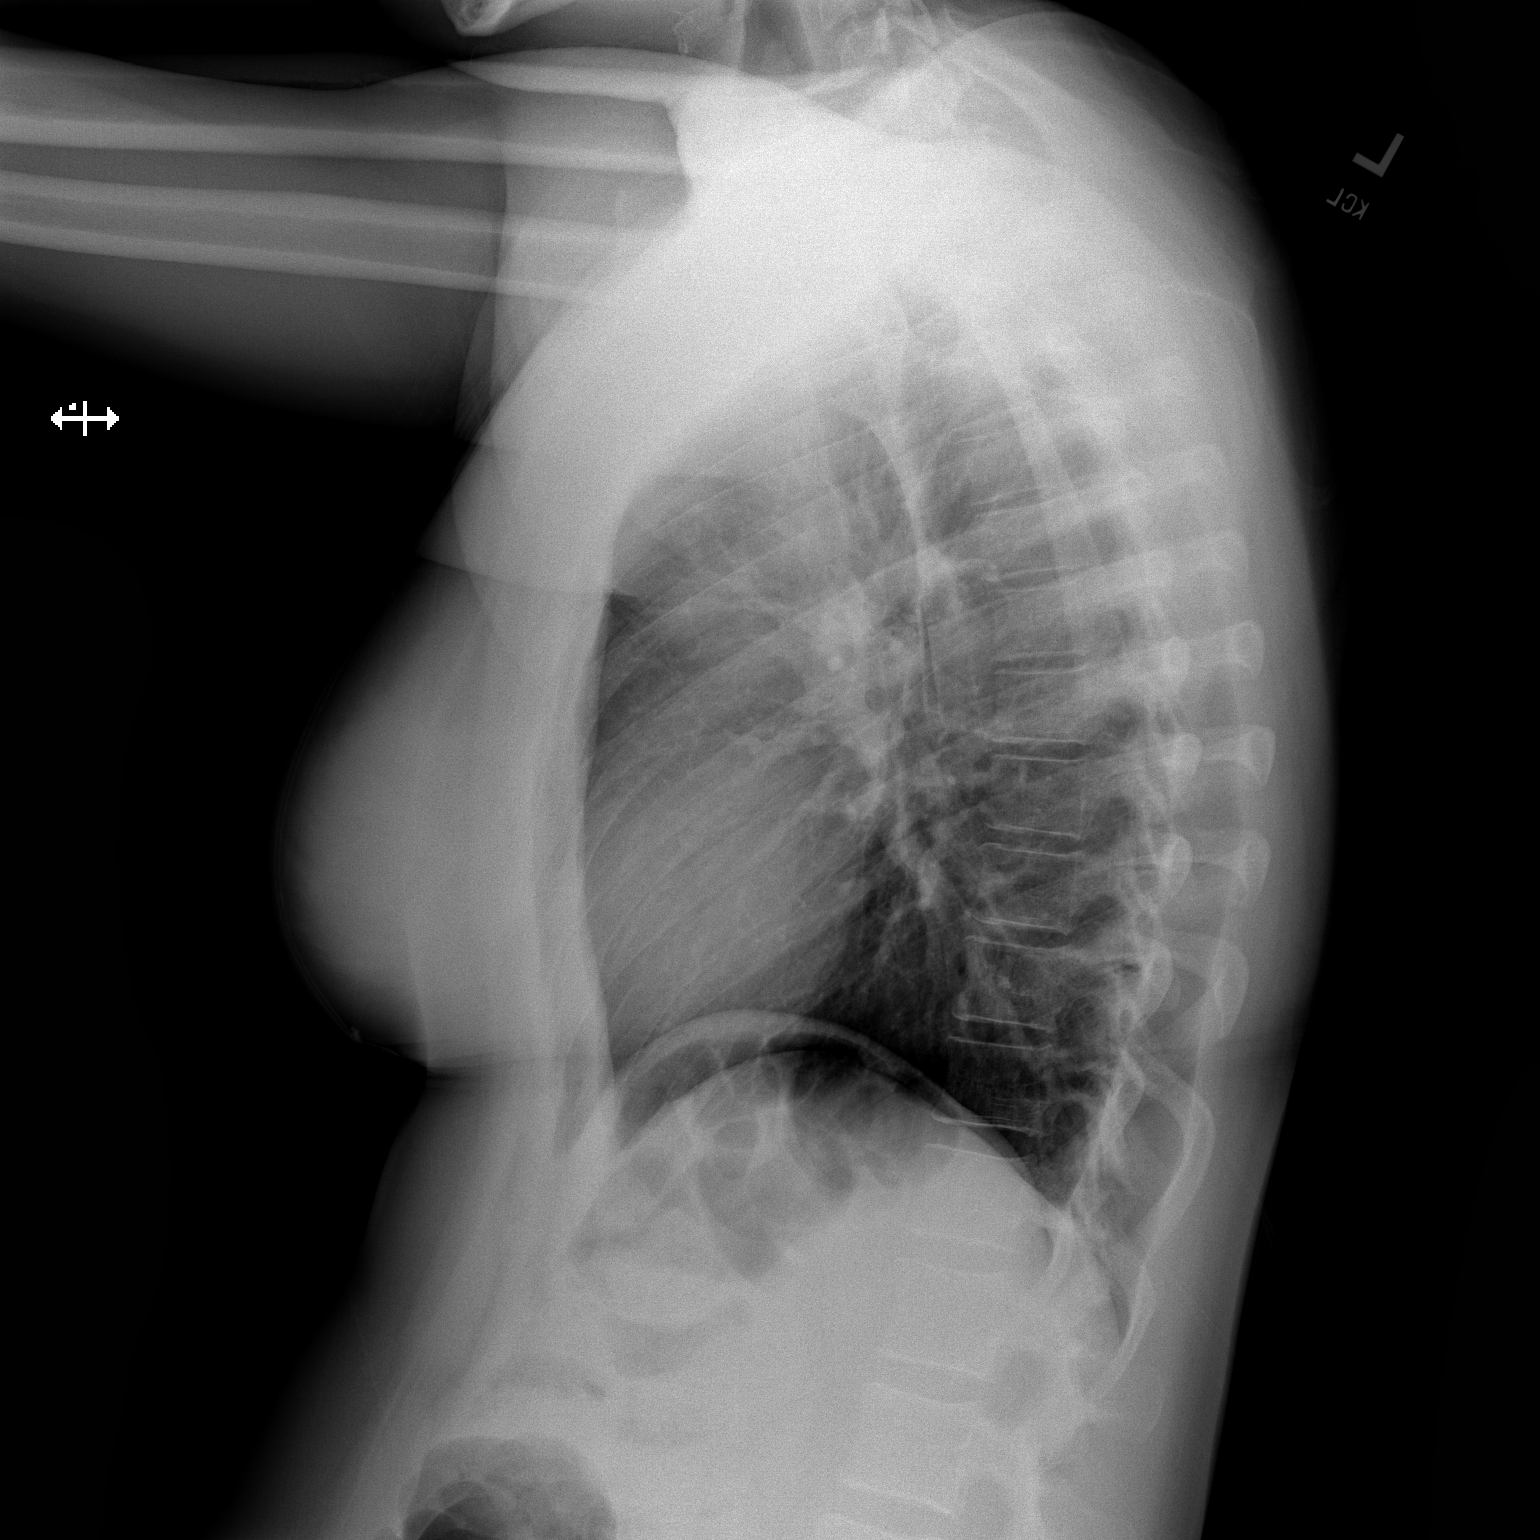

[2 of 2 positions shown; findings below may reference images not displayed]

FINDINGS: The heart size and mediastinal contours are within normal limits.
Both lungs are clear. The visualized skeletal structures are
unremarkable.
IMPRESSION: No active cardiopulmonary disease.

## 2019-02-18 ENCOUNTER — Emergency Department (HOSPITAL_COMMUNITY)
Admission: EM | Admit: 2019-02-18 | Discharge: 2019-02-18 | Disposition: A | Discharge status: Home / Self Care | Payer: Self-pay | Attending: Emergency Medicine | Admitting: Emergency Medicine

## 2019-02-18 ENCOUNTER — Other Ambulatory Visit: Payer: Self-pay

## 2019-02-18 ENCOUNTER — Encounter (HOSPITAL_COMMUNITY): Payer: Self-pay

## 2019-02-18 DIAGNOSIS — Z79899 Other long term (current) drug therapy: Secondary | ICD-10-CM | POA: Insufficient documentation

## 2019-02-18 DIAGNOSIS — J020 Streptococcal pharyngitis: Secondary | ICD-10-CM

## 2019-02-18 DIAGNOSIS — F17290 Nicotine dependence, other tobacco product, uncomplicated: Secondary | ICD-10-CM | POA: Insufficient documentation

## 2019-02-18 DIAGNOSIS — J45909 Unspecified asthma, uncomplicated: Secondary | ICD-10-CM | POA: Insufficient documentation

## 2019-02-18 DIAGNOSIS — J200 Acute bronchitis due to Mycoplasma pneumoniae: Secondary | ICD-10-CM | POA: Insufficient documentation

## 2019-02-18 MED ORDER — PENICILLIN G BENZATHINE 1200000 UNIT/2ML IM SUSP
1.2000 10*6.[IU] | Freq: Once | INTRAMUSCULAR | Status: AC
  Administered 2019-02-18: 1.2 10*6.[IU] via INTRAMUSCULAR
  Filled 2019-02-18: qty 2

## 2019-02-18 MED ORDER — ACETAMINOPHEN 500 MG PO TABS
1000.0000 mg | ORAL_TABLET | Freq: Once | ORAL | Status: AC
  Administered 2019-02-18: 20:00:00 1000 mg via ORAL
  Filled 2019-02-18: qty 2

## 2019-02-18 NOTE — Discharge Instructions (Signed)
Please read and follow all provided instructions.  Your diagnoses today include:  1. Streptococcal pharyngitis     Tests performed today include:  Vital signs. See below for your results today.   Medications prescribed:  You were given a one-time shot of penicillin to treat your strep throat.   Take any medications prescribed only as directed.   Home care instructions:  Please read the educational materials provided and follow any instructions contained in this packet.  You should isolate yourself in the next 7 days and until you have 3 days of improvement in your symptoms and resolution of fever.  Follow-up instructions: Please follow-up with your primary care provider as needed for further evaluation of your symptoms.  Return instructions:   Please return to the Emergency Department if you experience worsening symptoms.   Return if you have worsening problems swallowing, your neck becomes swollen, you cannot swallow your saliva or your voice becomes muffled.   Return with high persistent fever, persistent vomiting, or if you have trouble breathing.   Please return if you have any other emergent concerns.  Additional Information:  Your vital signs today were: BP 107/78 (BP Location: Left Arm)    Pulse (!) 123    Temp (!) 101.2 F (38.4 C) (Oral)    Resp 18    Wt 72.6 kg    SpO2 99%    BMI 28.34 kg/m  If your blood pressure (BP) was elevated above 135/85 this visit, please have this repeated by your doctor within one month. --------------

## 2019-02-18 NOTE — ED Notes (Signed)
Report given to Kaitlyn RN

## 2019-02-18 NOTE — ED Provider Notes (Signed)
[facility]-EMERGENCY DEPT Provider Note   CSN: [id number] Arrival date & time: 02/18/19  1820    History   Chief Complaint Chief Complaint  Patient presents with  . Fever  . Sore Throat  . Generalized Body Aches    HPI Christina Avila is a 28 y.o. female.     Patient with history of sickle cell trait presents the emergency department with 1 day history of generalized body aches, sore throat, and fever.  Patient has felt generally unwell.  She denies any cough or shortness of breath.  She has been treating her symptoms at home with ibuprofen.  No nausea, vomiting, or diarrhea.  No specific sick contacts or known contacts with COVID-19.  She works as a Designer, industrial/product. The onset of this condition was acute. The course is constant. Aggravating factors: none. Alleviating factors: none.       Past Medical History:  Diagnosis Date  . Abdominal pain, other specified site   . Asthma   . 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 Boxer's metacarpal fracture, neck, closed 02/04/2018  . Hernia     Past Surgical History:  Procedure Laterality Date  . HERNIA REPAIR     umbilical at age 75  . UMBILICAL HERNIA REPAIR       OB History   No obstetric history on file.      Home Medications    Prior to Admission medications   Medication Sig Start Date End Date Taking? Authorizing Provider  acetaminophen (TYLENOL) 500 MG tablet Take 1,000 mg by mouth every 6 (six) hours as needed. For pain/fever    [provider]  benzonatate (TESSALON) 100 MG capsule Take 1 capsule (100 mg total) by mouth every 8 (eight) hours. 12/10/18   Kendrick, Caitlyn S, PA-C  ibuprofen (ADVIL,MOTRIN) 600 MG tablet Take 1 tablet (600 mg total) by mouth every 8 (eight) hours as needed. 01/24/18   Anders Simmonds, PA-C    Family History Family History  Problem Relation Age of Onset  . Diabetes Mother   .  Hyperlipidemia Mother     Social History Social History   Tobacco Use  . Smoking status: Current Every Day Smoker    Packs/day: 1.00    Types: Cigars  . Smokeless tobacco: Never Used  Substance Use Topics  . Alcohol use: Yes    Comment: OCC  . Drug use: No    Types: Marijuana    Comment: former user     Allergies   Patient has no known allergies.   Review of Systems Review of Systems  Constitutional: Positive for fatigue. Negative for chills and fever.  HENT: Positive for sore throat. Negative for congestion, ear pain, rhinorrhea, sinus pressure and trouble swallowing.   Eyes: Negative for redness.  Respiratory: Negative for cough and wheezing.   Gastrointestinal: Negative for abdominal pain, diarrhea, nausea and vomiting.  Genitourinary: Negative for dysuria.  Musculoskeletal: Positive for myalgias. Negative for neck stiffness.  Skin: Negative for rash.  Neurological: Negative for headaches.  Hematological: Negative for adenopathy.     Physical Exam Updated Vital Signs BP 112/73 (BP Location: Right Leg)   Pulse 100   Temp 98.4 F (36.9 C) (Oral)   Resp 18   Wt 72.6 kg   SpO2 98%   BMI 28.34 kg/m   Physical Exam Vitals signs and nursing note reviewed.  Constitutional:  Appearance: She is well-developed.  HENT:     Head: Normocephalic and atraumatic.     Jaw: No trismus.     Right Ear: Tympanic membrane, ear canal and external ear normal.     Left Ear: Tympanic membrane, ear canal and external ear normal.     Nose: Nose normal. No mucosal edema or rhinorrhea.     Mouth/Throat:     Mouth: Mucous membranes are not dry. No oral lesions.     Pharynx: Uvula midline. Posterior oropharyngeal erythema present. No uvula swelling.     Tonsils: Tonsillar exudate present. No tonsillar abscesses. 3+ on the right. 3+ on the left.  Eyes:     General:        Right eye: No discharge.        Left eye: No discharge.     Conjunctiva/sclera: Conjunctivae normal.   Neck:     Musculoskeletal: Normal range of motion and neck supple.  Cardiovascular:     Rate and Rhythm: Normal rate and regular rhythm.     Heart sounds: Normal heart sounds.  Pulmonary:     Effort: Pulmonary effort is normal. No respiratory distress.     Breath sounds: Normal breath sounds. No wheezing or rales.  Abdominal:     Palpations: Abdomen is soft.     Tenderness: There is no abdominal tenderness.  Lymphadenopathy:     Cervical: Cervical adenopathy present.  Skin:    General: Skin is warm and dry.  Neurological:     Mental Status: She is alert.      ED Treatments / Results  Labs (all labs ordered are listed, but only abnormal results are displayed) Labs Reviewed - No data to display  EKG None  Radiology No results found.  Procedures Procedures (including critical care time)  Medications Ordered in ED Medications  penicillin g benzathine (BICILLIN LA) 1200000 UNIT/2ML injection 1.2 Million Units (1.2 Million Units Intramuscular Given 02/18/19 1957)  acetaminophen (TYLENOL) tablet 1,000 mg (1,000 mg Oral Given 02/18/19 1958)     Initial Impression / Assessment and Plan / ED Course  I have reviewed the triage vital signs and the nursing notes.  Pertinent labs & imaging results that were available during my care of the patient were reviewed by me and considered in my medical decision making (see chart for details).        Patient seen and examined.  Patient with history and exam suggestive of streptococcal pharyngitis.  Will treat with IM Bicillin.  No respiratory complaints with difficulty swallowing.  Conservative measures indicated at this point.  Discussed isolation precautions in setting of current pandemic however I feel patient is low risk for COVID-19 infection at this point.  Patient appears well, nontoxic and is in no distress.  Vital signs reviewed and are as follows: BP 112/73 (BP Location: Right Leg)   Pulse 100   Temp 98.4 F (36.9 C) (Oral)    Resp 18   Wt 72.6 kg   SpO2 98%   BMI 28.34 kg/m     Final Clinical Impressions(s) / ED Diagnoses   Final diagnoses:  Streptococcal pharyngitis   Presumptive streptococcal pharyngitis and well-appearing patient.  No neck pain or difficulty with movement suggestive of a deep space infection and neck cannot feel advanced imaging is indicated at this time.  Patient is in no respiratory distress without signs of peritonsillar abscess.  ED Discharge Orders    None       Renne CriglerGeiple, Kennith Morss, [place] JerseyPA-C 02/18/19 2021  Linwood Dibbles, MD 02/19/19 1534

## 2019-02-18 NOTE — ED Triage Notes (Addendum)
Pt reports that she began having Sore Throat, Body aches, fever since yesterday. Pt reports traveling to [place] in March. Pt denies Cough or shortness of breath

## 2019-10-04 ENCOUNTER — Other Ambulatory Visit: Payer: Self-pay

## 2019-10-04 ENCOUNTER — Emergency Department (HOSPITAL_COMMUNITY)
Admission: EM | Admit: 2019-10-04 | Discharge: 2019-10-04 | Disposition: A | Discharge status: Home / Self Care | Payer: Self-pay | Attending: Emergency Medicine | Admitting: Emergency Medicine

## 2019-10-04 ENCOUNTER — Encounter (HOSPITAL_COMMUNITY): Payer: Self-pay | Admitting: Emergency Medicine

## 2019-10-04 DIAGNOSIS — Z20828 Contact with and (suspected) exposure to other viral communicable diseases: Secondary | ICD-10-CM | POA: Insufficient documentation

## 2019-10-04 DIAGNOSIS — R05 Cough: Secondary | ICD-10-CM | POA: Insufficient documentation

## 2019-10-04 DIAGNOSIS — J45909 Unspecified asthma, uncomplicated: Secondary | ICD-10-CM | POA: Insufficient documentation

## 2019-10-04 DIAGNOSIS — M791 Myalgia, unspecified site: Secondary | ICD-10-CM | POA: Insufficient documentation

## 2019-10-04 DIAGNOSIS — F17290 Nicotine dependence, other tobacco product, uncomplicated: Secondary | ICD-10-CM | POA: Insufficient documentation

## 2019-10-04 DIAGNOSIS — Z20822 Contact with and (suspected) exposure to covid-19: Secondary | ICD-10-CM

## 2019-10-04 DIAGNOSIS — R519 Headache, unspecified: Secondary | ICD-10-CM | POA: Insufficient documentation

## 2019-10-04 MED ORDER — ALBUTEROL SULFATE HFA 108 (90 BASE) MCG/ACT IN AERS
2.0000 | INHALATION_SPRAY | RESPIRATORY_TRACT | Status: DC | PRN
  Administered 2019-10-04: 2 via RESPIRATORY_TRACT
  Filled 2019-10-04: qty 6.7

## 2019-10-04 NOTE — Discharge Instructions (Signed)
Please read and follow all provided instructions.  Your diagnoses today include:  1. Suspected COVID-19 virus infection     Tests performed today include:  Coronavirus test -  pending, should be back in the next 24 hours  Vital signs. See below for your results today.   Medications prescribed:   Albuterol inhaler - medication that opens up your airway  Use inhaler as follows: 1-2 puffs with spacer every 4 hours as needed for wheezing, cough, or shortness of breath.   Please use over-the-counter NSAID medications (ibuprofen, naproxen) as directed on the packaging for pain.   Take any prescribed medications only as directed.  Home care instructions:  Follow any educational materials contained in this packet.  BE VERY CAREFUL not to take multiple medicines containing Tylenol (also called acetaminophen). Doing so can lead to an overdose which can damage your liver and cause liver failure and possibly death.   Follow-up instructions: Please follow-up with your primary care provider in the next 3 days for further evaluation of your symptoms.   Return instructions:   Please return to the Emergency Department if you experience worsening symptoms.   Return with worsening shortness of breath, increased work of breathing, persistent vomiting, persistent fever.  Please return if you have any other emergent concerns.  Additional Information:  Your vital signs today were: BP 119/79 (BP Location: Left Arm)    Pulse 84    Temp 99.2 F (37.3 C) (Oral)    Resp 18    SpO2 100%  If your blood pressure (BP) was elevated above 135/85 this visit, please have this repeated by your doctor within one month. --------------

## 2019-10-04 NOTE — ED Provider Notes (Signed)
[facility]-EMERGENCY DEPT Provider Note   CSN: [id number] Arrival date & time: 10/04/19  1205     History   Chief Complaint Chief Complaint  Patient presents with  . Covid +  . Cough  . Headache    HPI Christina Avila is a 28 y.o. female.     Patient with history of asthma presents the emergency department with coronavirus symptoms.  She has a positive high risk contact of her partner who tested positive yesterday.  Patient complains of body aches, headache, occasional nonproductive cough.  She has not had any fevers.  No chest pain or shortness of breath.  No loss of taste or smell.  No abdominal pain or urinary symptoms.  No skin rashes.  No treatments prior to arrival.  Patient has not had any wheezing.  The onset of this condition was acute. The course is constant. Aggravating factors: none. Alleviating factors: none.       Past Medical History:  Diagnosis Date  . Abdominal pain, other specified site   . Asthma   . 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 Boxer's metacarpal fracture, neck, closed 02/04/2018  . Hernia     Past Surgical History:  Procedure Laterality Date  . HERNIA REPAIR     umbilical at age 35  . UMBILICAL HERNIA REPAIR       OB History   No obstetric history on file.      Home Medications    Prior to Admission medications   Medication Sig Start Date End Date Taking? Authorizing Provider  acetaminophen (TYLENOL) 500 MG tablet Take 1,000 mg by mouth every 6 (six) hours as needed. For pain/fever    [provider]  benzonatate (TESSALON) 100 MG capsule Take 1 capsule (100 mg total) by mouth every 8 (eight) hours. 12/10/18   Kendrick, Caitlyn S, PA-C  ibuprofen (ADVIL,MOTRIN) 600 MG tablet Take 1 tablet (600 mg total) by mouth every 8 (eight) hours as needed. 01/24/18   Anders Simmonds, PA-C    Family History Family History  Problem Relation Age of  Onset  . Diabetes Mother   . Hyperlipidemia Mother     Social History Social History   Tobacco Use  . Smoking status: Current Every Day Smoker    Packs/day: 1.00    Types: Cigars  . Smokeless tobacco: Never Used  Substance Use Topics  . Alcohol use: Yes    Comment: OCC  . Drug use: No    Types: Marijuana    Comment: former user     Allergies   Patient has no known allergies.   Review of Systems Review of Systems  Constitutional: Positive for fatigue. Negative for chills and fever.  HENT: Positive for congestion. Negative for ear pain, rhinorrhea, sinus pressure and sore throat.   Eyes: Negative for redness.  Respiratory: Positive for cough. Negative for shortness of breath and wheezing.   Gastrointestinal: Negative for abdominal pain, diarrhea, nausea and vomiting.  Genitourinary: Negative for dysuria.  Musculoskeletal: Positive for myalgias. Negative for neck stiffness.  Skin: Negative for rash.  Neurological: Positive for headaches.  Hematological: Negative for adenopathy.     Physical Exam Updated Vital Signs BP 119/79 (BP Location: Left Arm)   Pulse 84   Temp 99.2 F (37.3 C) (Oral)   Resp 18   SpO2 100%   Physical Exam Vitals signs and nursing note reviewed.  Constitutional:      Appearance: She is well-developed.  HENT:     Head: Normocephalic and atraumatic.     Jaw: No trismus.      Right Ear: Tympanic membrane, ear canal and external ear normal.     Left Ear: Tympanic membrane, ear canal and external ear normal.     Nose: Nose normal. No mucosal edema or rhinorrhea.     Mouth/Throat:     Mouth: Mucous membranes are not dry. No oral lesions.     Pharynx: Uvula midline. No oropharyngeal exudate, posterior oropharyngeal erythema or uvula swelling.     Tonsils: No tonsillar abscesses.  Eyes:     General:        Right eye: No discharge.        Left eye: No discharge.     Conjunctiva/sclera: Conjunctivae normal.  Neck:     Musculoskeletal:  Normal range of motion and neck supple.  Cardiovascular:     Rate and Rhythm: Normal rate.  Pulmonary:     Effort: Pulmonary effort is normal. No respiratory distress.  Abdominal:     Palpations: Abdomen is soft.     Tenderness: There is no abdominal tenderness.  Lymphadenopathy:     Cervical: No cervical adenopathy.  Skin:    General: Skin is warm and dry.  Neurological:     Mental Status: She is alert.      ED Treatments / Results  Labs (all labs ordered are listed, but only abnormal results are displayed) Labs Reviewed  NOVEL CORONAVIRUS, NAA (HOSP ORDER, SEND-OUT TO REF LAB; TAT 18-24 HRS)    EKG None  Radiology No results found.  Procedures Procedures (including critical care time)  Medications Ordered in ED Medications  albuterol (VENTOLIN HFA) 108 (90 Base) MCG/ACT inhaler 2 puff (has no administration in time range)     Initial Impression / Assessment and Plan / ED Course  I have reviewed the triage vital signs and the nursing notes.  Pertinent labs & imaging results that were available during my care of the patient were reviewed by me and considered in my medical decision making (see chart for details).        Patient seen and examined.  Well-appearing patient with positive high risk coronavirus contact with symptoms suggestive of coronavirus.  No indications for admission today.  No hypoxia, tachycardia.  No signs of sepsis.  Patient will be treated symptomatically.  Covid testing ordered.  Will give an albuterol inhaler to use in case of wheezing.  Discussed isolation precautions with patient.  Work note given.  Discussed signs symptoms to return including worsening shortness of breath, increased work of breathing, persistent vomiting.  Vital signs reviewed and are as follows: BP 119/79 (BP Location: Left Arm)   Pulse 84   Temp 99.2 F (37.3 C) (Oral)   Resp 18   SpO2 100%     Final Clinical Impressions(s) / ED Diagnoses   Final diagnoses:   Suspected COVID-19 virus infection   Patient with suspected SARS-CoV-2 infection, mild symptoms at this time.  Reassuring vital signs.  No respiratory distress or increased work of breathing.  Patient will be discharged home with appropriate precautions and return instructions.   ED Discharge Orders    None       Carlisle Cater, Hershal Coria 10/04/19 1333    Julianne Rice, MD 10/04/19 2139

## 2019-10-04 NOTE — ED Triage Notes (Signed)
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 Patient now feels ill complaining of a headache and cough.

## 2019-10-06 ENCOUNTER — Encounter: Payer: Self-pay | Admitting: Internal Medicine

## 2019-10-06 LAB — NOVEL CORONAVIRUS, NAA (HOSP ORDER, SEND-OUT TO REF LAB; TAT 18-24 HRS): SARS-CoV-2, NAA: NOT DETECTED

## 2019-10-14 ENCOUNTER — Encounter: Payer: Self-pay | Admitting: Internal Medicine

## 2022-04-19 ENCOUNTER — Ambulatory Visit
Admission: EM | Admit: 2022-04-19 | Discharge: 2022-04-19 | Disposition: A | Discharge status: Home / Self Care | Payer: Commercial Managed Care - HMO | Attending: Family Medicine | Admitting: Family Medicine

## 2022-04-19 DIAGNOSIS — L03221 Cellulitis of neck: Secondary | ICD-10-CM | POA: Diagnosis not present

## 2022-04-19 DIAGNOSIS — Z113 Encounter for screening for infections with a predominantly sexual mode of transmission: Secondary | ICD-10-CM | POA: Diagnosis not present

## 2022-04-19 DIAGNOSIS — N760 Acute vaginitis: Secondary | ICD-10-CM | POA: Insufficient documentation

## 2022-04-19 MED ORDER — PREDNISONE 20 MG PO TABS: 40.0000 mg | ORAL_TABLET | Freq: Every day | ORAL | 0 refills | Status: AC

## 2022-04-19 MED ORDER — AMOXICILLIN-POT CLAVULANATE 875-125 MG PO TABS: 1.0000 | ORAL_TABLET | Freq: Two times a day (BID) | ORAL | 0 refills | Status: AC

## 2022-04-19 NOTE — Discharge Instructions (Addendum)
Take amoxicillin-clavulanate 875 mg--1 tab twice daily with food for 7 days  Prednisone 20 mg--2 daily for 5 days  Staff will notify you if anything is positive on the swab

## 2022-04-19 NOTE — ED Triage Notes (Signed)
Pt states she had an unknown insect bite her on the back of her head X 2 days ago and has had head & neck soreness since then.

## 2022-04-19 NOTE — ED Provider Notes (Signed)
[facility]    CSN: [id number] Arrival date & time: 04/19/22  1526      History   Chief Complaint Chief Complaint  Patient presents with   Insect Bite    HPI JESICA GOHEEN is a 31 y.o. female.   HPI Here for an insect bite on her posterior neck.  She feels it happened last night when she first laid down in bed.  Then she has had soreness coming from that area to extending into her left posterior shoulder.  No fever or chills.  No known injury.  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      Home Medications    Prior to Admission medications   Medication Sig Start Date End Date Taking? Authorizing Provider  amoxicillin-clavulanate (AUGMENTIN) 875-125 MG tablet Take 1 tablet by mouth 2 (two) times daily for 7 days. 04/19/22 04/26/22 Yes Zakari Bathe, Janace Aris, MD  predniSONE (DELTASONE) 20 MG tablet Take 2 tablets (40 mg total) by mouth daily with breakfast for 5 days. 04/19/22 04/24/22 Yes Devlin Mcveigh, Janace Aris, MD  acetaminophen (TYLENOL) 500 MG tablet Take 1,000 mg by mouth every 6 (six) hours as needed. For pain/fever    [provider]    Family History Family History  Problem Relation Age of Onset   Diabetes Mother    Hyperlipidemia Mother     Social History Social History   Tobacco Use   Smoking status: Every Day    Packs/day: 1.00    Types: Cigars, Cigarettes   Smokeless tobacco: Never  Vaping Use   Vaping Use: Never used  Substance Use Topics   Alcohol use: Yes    Comment: OCC    Drug use: No    Types: Marijuana    Comment: former user     Allergies   Patient has no known allergies.   Review of Systems Review of Systems   Physical Exam Triage Vital Signs ED Triage Vitals [04/19/22 1544]  Enc Vitals Group     BP 118/81     Pulse Rate (!) 102     Resp 18     Temp 98.1 F (36.7 C)     Temp Source Oral     SpO2 98 %     Weight      Height      Head Circumference      Peak Flow      Pain Score 6     Pain Loc      Pain Edu?      Excl. in GC?    No data found.  Updated Vital Signs BP 118/81 (BP Location: Left Arm)   Pulse (!) 102   Temp 98.1 F (36.7 C) (Oral)   Resp 18   LMP  (LMP Unknown)   SpO2 98%   Visual Acuity Right Eye Distance:   Left Eye Distance:   Bilateral Distance:  Right Eye Near:   Left Eye Near:    Bilateral Near:     Physical Exam Vitals reviewed.  Constitutional:      General: She is not in acute distress.    Appearance: She is not toxic-appearing.  Eyes:     Extraocular Movements: Extraocular movements intact.     Pupils: Pupils are equal, round, and reactive to light.  Neck:     Comments: There is an area of mild erythema and induration about 1/2 x 1 cm on her posterior neck.  It is mildly tender there.  She is also tender on her musculature of her trapezius on the left in the neck and upper back.  No rash is seen extending from the insect bite area. Cardiovascular:     Rate and Rhythm: Normal rate and regular rhythm.  Pulmonary:     Effort: Pulmonary effort is normal.     Breath sounds: Normal breath sounds.  Musculoskeletal:     Cervical back: Neck supple.  Skin:    Coloration: Skin is not jaundiced or pale.  Neurological:     Mental Status: She is alert and oriented to person, place, and time.  Psychiatric:        Behavior: Behavior normal.      UC Treatments / Results  Labs (all labs ordered are listed, but only abnormal results are displayed) Labs Reviewed  CERVICOVAGINAL ANCILLARY ONLY     EKG   Radiology No results found.  Procedures Procedures (including critical care time)  Medications Ordered in UC Medications - No data to display  Initial Impression / Assessment and Plan / UC Course  I have reviewed the triage vital signs and the nursing notes.  Pertinent labs & imaging results that were available during my care of the patient were reviewed by me and considered in my medical decision making (see chart for details).     We will treat for possible cellulitis and also possible reaction to insect bite.  STD testing done and we will notify her and treat per protocol anything that is positive Final Clinical Impressions(s) / UC Diagnoses   Final diagnoses:  Cellulitis of neck     Discharge Instructions      Take amoxicillin-clavulanate 875 mg--1 tab twice daily with food for 7 days  Prednisone 20 mg--2 daily for 5 days  Staff will notify you if anything is positive on the swab     ED Prescriptions     Medication Sig Dispense Auth. Provider   amoxicillin-clavulanate (AUGMENTIN) 875-125 MG tablet Take 1 tablet by mouth 2 (two) times daily for 7 days. 14 tablet Ishita Mcnerney, Janace Aris, MD   predniSONE (DELTASONE) 20 MG tablet Take 2 tablets (40 mg total) by mouth daily with breakfast for 5 days. 10 tablet Marlinda Mike Janace Aris, MD      PDMP not reviewed this encounter.   Zenia Resides, MD 04/19/22 1600

## 2022-04-20 ENCOUNTER — Telehealth (HOSPITAL_COMMUNITY): Payer: Self-pay | Admitting: Emergency Medicine

## 2022-04-20 LAB — CERVICOVAGINAL ANCILLARY ONLY
Bacterial Vaginitis (gardnerella): POSITIVE — AB
Candida Glabrata: NEGATIVE
Candida Vaginitis: NEGATIVE
Chlamydia: NEGATIVE
Comment: NEGATIVE
Comment: NEGATIVE
Comment: NEGATIVE
Comment: NEGATIVE
Comment: NEGATIVE
Comment: NORMAL
Neisseria Gonorrhea: NEGATIVE
Trichomonas: NEGATIVE

## 2022-04-20 MED ORDER — METRONIDAZOLE 500 MG PO TABS: 500.0000 mg | ORAL_TABLET | Freq: Two times a day (BID) | ORAL | 0 refills | Status: DC

## 2022-06-19 ENCOUNTER — Emergency Department (HOSPITAL_COMMUNITY)
Admission: EM | Admit: 2022-06-19 | Discharge: 2022-06-19 | Disposition: A | Discharge status: Home / Self Care | Payer: No Typology Code available for payment source | Attending: Emergency Medicine | Admitting: Emergency Medicine

## 2022-06-19 ENCOUNTER — Emergency Department (HOSPITAL_COMMUNITY): Payer: No Typology Code available for payment source

## 2022-06-19 ENCOUNTER — Other Ambulatory Visit: Payer: Self-pay

## 2022-06-19 ENCOUNTER — Encounter (HOSPITAL_COMMUNITY): Payer: Self-pay | Admitting: Emergency Medicine

## 2022-06-19 DIAGNOSIS — N9489 Other specified conditions associated with female genital organs and menstrual cycle: Secondary | ICD-10-CM | POA: Diagnosis not present

## 2022-06-19 DIAGNOSIS — R109 Unspecified abdominal pain: Secondary | ICD-10-CM

## 2022-06-19 DIAGNOSIS — N201 Calculus of ureter: Secondary | ICD-10-CM | POA: Diagnosis not present

## 2022-06-19 LAB — COMPREHENSIVE METABOLIC PANEL
ALT: 22 U/L (ref 0–44)
AST: 22 U/L (ref 15–41)
Albumin: 3.9 g/dL (ref 3.5–5.0)
Alkaline Phosphatase: 63 U/L (ref 38–126)
Anion gap: 8 (ref 5–15)
BUN: 8 mg/dL (ref 6–20)
CO2: 24 mmol/L (ref 22–32)
Calcium: 9.5 mg/dL (ref 8.9–10.3)
Chloride: 104 mmol/L (ref 98–111)
Creatinine, Ser: 0.89 mg/dL (ref 0.44–1.00)
GFR, Estimated: >60 mL/min (ref >60)
Glucose, Bld: 107 mg/dL — ABNORMAL HIGH (ref 70–99)
Potassium: 3.9 mmol/L (ref 3.5–5.1)
Sodium: 136 mmol/L (ref 135–145)
Total Bilirubin: 0.6 mg/dL (ref 0.3–1.2)
Total Protein: 7.8 g/dL (ref 6.5–8.1)

## 2022-06-19 LAB — URINALYSIS, ROUTINE W REFLEX MICROSCOPIC
Bilirubin Urine: NEGATIVE
Glucose, UA: NEGATIVE mg/dL
Hgb urine dipstick: NEGATIVE
Ketones, ur: NEGATIVE mg/dL
Leukocytes,Ua: NEGATIVE
Nitrite: NEGATIVE
Protein, ur: NEGATIVE mg/dL
Specific Gravity, Urine: 1.019 (ref 1.005–1.030)
pH: 5 (ref 5.0–8.0)

## 2022-06-19 LAB — I-STAT BETA HCG BLOOD, ED (MC, WL, AP ONLY): I-stat hCG, quantitative: <5 m[IU]/mL (ref <5)

## 2022-06-19 LAB — CBC WITH DIFFERENTIAL/PLATELET
Abs Immature Granulocytes: 0.04 10*3/uL (ref 0.00–0.07)
Basophils Absolute: 0.1 10*3/uL (ref 0.0–0.1)
Basophils Relative: 1 %
Eosinophils Absolute: 0.1 10*3/uL (ref 0.0–0.5)
Eosinophils Relative: 1 %
HCT: 36 % (ref 36.0–46.0)
Hemoglobin: 12.1 g/dL (ref 12.0–15.0)
Immature Granulocytes: 0 %
Lymphocytes Relative: 34 %
Lymphs Abs: 3.5 10*3/uL (ref 0.7–4.0)
MCH: 25.4 pg — ABNORMAL LOW (ref 26.0–34.0)
MCHC: 33.6 g/dL (ref 30.0–36.0)
MCV: 75.5 fL — ABNORMAL LOW (ref 80.0–100.0)
Monocytes Absolute: 0.8 10*3/uL (ref 0.1–1.0)
Monocytes Relative: 8 %
Neutro Abs: 5.7 10*3/uL (ref 1.7–7.7)
Neutrophils Relative %: 56 %
Platelets: 336 10*3/uL (ref 150–400)
RBC: 4.77 MIL/uL (ref 3.87–5.11)
RDW: 14.3 % (ref 11.5–15.5)
WBC: 10.2 10*3/uL (ref 4.0–10.5)
nRBC: 0 % (ref 0.0–0.2)

## 2022-06-19 LAB — LIPASE, BLOOD: Lipase: 34 U/L (ref 11–51)

## 2022-06-19 MED ORDER — KETOROLAC TROMETHAMINE 15 MG/ML IJ SOLN
15.0000 mg | Freq: Once | INTRAMUSCULAR | Status: AC
  Administered 2022-06-19: 15 mg via INTRAVENOUS
  Filled 2022-06-19: qty 1

## 2022-06-19 MED ORDER — ALUM & MAG HYDROXIDE-SIMETH 200-200-20 MG/5ML PO SUSP
30.0000 mL | Freq: Once | ORAL | Status: AC
  Administered 2022-06-19: 30 mL via ORAL
  Filled 2022-06-19: qty 30

## 2022-06-19 MED ORDER — FAMOTIDINE IN NACL 20-0.9 MG/50ML-% IV SOLN
20.0000 mg | Freq: Once | INTRAVENOUS | Status: AC
  Administered 2022-06-19: 20 mg via INTRAVENOUS
  Filled 2022-06-19: qty 50

## 2022-06-19 MED ORDER — TAMSULOSIN HCL 0.4 MG PO CAPS: 0.4000 mg | ORAL_CAPSULE | Freq: Every day | ORAL | 0 refills | Status: DC

## 2022-06-19 NOTE — Discharge Instructions (Addendum)
It was a pleasure taking care of you today!   Your CT scan showed a stone in the left ureter. You will be sent a prescription for flomax, take as prescribed.  Follow-up with your primary care provider regarding today's ED visit.  You may take over-the-counter 600 mg ibuprofen every 6 hours as needed for pain for no more than 7 days.  Return to the ED if you are experiencing increasing/worsening abdominal pain, vomiting, flank pain, fever, worsening symptoms.

## 2022-06-19 NOTE — ED Provider Triage Note (Signed)
Emergency Medicine Provider Triage Evaluation Note  Christina Avila , a 31 y.o. female  was evaluated in triage.  Pt complains of left flank pain onset 1-2 months intermittently. Has associated dysuria. Denies hematuria, nausea, vomiting. Denies history of UTI or kidney stones. Hasn't been evaluated for this before.    Review of Systems  Positive: As per HPI Negative:   Physical Exam  BP (!) 153/104   Pulse (!) 108   Temp 98.6 F (37 C) (Oral)   Resp 18   Ht 5\' 3"  (1.6 m)   Wt 90.7 kg   SpO2 99%   BMI 35.43 kg/m  Gen:   Awake, no distress   Resp:  Normal effort  MSK:   Moves extremities without difficulty  Other:  Left CVA TTP. TTP to epigastric region.   Medical Decision Making  Medically screening exam initiated at 12:50 PM.  Appropriate orders placed.  was informed that the remainder of the evaluation will be completed by another provider, this initial triage assessment does not replace that evaluation, and the importance of remaining in the ED until their evaluation is complete.  Work-up initiated.    Christina Avila A, PA-C 06/19/22 1251

## 2022-06-19 NOTE — ED Provider Notes (Signed)
[facility]-EMERGENCY DEPT Provider Note   CSN: [id number] Arrival date & time: 06/19/22  1239     History  Chief Complaint  Patient presents with   Flank Pain    Christina Avila is a 31 y.o. female who presents to the ED complaining of left flank pain onset 1-2 months intermittently. Has associated dysuria. Denies abdominal pain, hematuria, nausea, vomiting. Denies history of UTI or kidney stones. Hasn't been evaluated for this before.    The history is provided by the patient. No language interpreter was used.       Home Medications Prior to Admission medications   Medication Sig Start Date End Date Taking? Authorizing Provider  acetaminophen (TYLENOL) 500 MG tablet Take 1,000 mg by mouth every 6 (six) hours as needed for moderate pain or headache. For pain/fever   Yes [provider]  tamsulosin (FLOMAX) 0.4 MG CAPS capsule Take 1 capsule (0.4 mg total) by mouth daily. 06/19/22  Yes Leiliana Foody A, PA-C  metroNIDAZOLE (FLAGYL) 500 MG tablet Take 1 tablet (500 mg total) by mouth 2 (two) times daily. Patient not taking: Reported on 06/19/2022 04/20/22   Merrilee Jansky, MD      Allergies    Patient has no known allergies.    Review of Systems   Review of Systems  Constitutional:  Negative for fever.  Gastrointestinal:  Negative for abdominal pain, nausea and vomiting.  Genitourinary:  Positive for dysuria and flank pain. Negative for hematuria.  All other systems reviewed and are negative.   Physical Exam Updated Vital Signs BP (!) 122/97   Pulse 81   Temp 98 F (36.7 C) (Oral)   Resp 18   Ht 5\' 3"  (1.6 m)   Wt 90.7 kg   LMP 06/06/2022 (Approximate) Comment: neg preg  SpO2 100%   BMI 35.43 kg/m  Physical Exam Vitals and nursing note reviewed.  Constitutional:      General: She is not in acute distress.    Appearance: She is not diaphoretic.  HENT:     Head: Normocephalic and atraumatic.     Mouth/Throat:     Pharynx: No  oropharyngeal exudate.  Eyes:     General: No scleral icterus.    Conjunctiva/sclera: Conjunctivae normal.  Cardiovascular:     Rate and Rhythm: Normal rate and regular rhythm.     Pulses: Normal pulses.     Heart sounds: Normal heart sounds.  Pulmonary:     Effort: Pulmonary effort is normal. No respiratory distress.     Breath sounds: Normal breath sounds. No wheezing.  Abdominal:     General: Bowel sounds are normal.     Palpations: Abdomen is soft. There is no mass.     Tenderness: There is abdominal tenderness in the epigastric area. There is left CVA tenderness. There is no right CVA tenderness, guarding or rebound.     Comments: Mild epigastric tenderness to palpation.  Left CVA tenderness to palpation.  Musculoskeletal:        General: Normal range of motion.     Cervical back: Normal range of motion and neck supple.  Skin:    General: Skin is warm and dry.  Neurological:     Mental Status: She is alert.  Psychiatric:        Behavior: Behavior normal.     ED Results / Procedures / Treatments   Labs (all labs ordered are listed, but only abnormal results are displayed) Labs Reviewed  CBC WITH DIFFERENTIAL/PLATELET -  Abnormal; Notable for the following components:      Result Value   MCV 75.5 (*)    MCH 25.4 (*)    All other components within normal limits  COMPREHENSIVE METABOLIC PANEL - Abnormal; Notable for the following components:   Glucose, Bld 107 (*)    All other components within normal limits  URINALYSIS, ROUTINE W REFLEX MICROSCOPIC  LIPASE, BLOOD  I-STAT BETA HCG BLOOD, ED ([facility], [facility], [facility] ONLY)    EKG None  Radiology CT Renal Stone Study  Result Date: 06/19/2022 CLINICAL DATA:  Left flank pain EXAM: CT ABDOMEN AND PELVIS WITHOUT CONTRAST TECHNIQUE: Multidetector CT imaging of the abdomen and pelvis was performed following the standard protocol without IV contrast. RADIATION DOSE REDUCTION: This exam was performed according to the departmental  dose-optimization program which includes automated exposure control, adjustment of the mA and/or kV according to patient size and/or use of iterative reconstruction technique. COMPARISON:  May 17, 2011, December 04, 2007 FINDINGS: Lower chest: No acute abnormality. Hepatobiliary: No focal liver abnormality is seen. No gallstones, gallbladder wall thickening, or biliary dilatation. Pancreas: Unremarkable. No pancreatic ductal dilatation or surrounding inflammatory changes. Spleen: Normal in size without focal abnormality. Adrenals/Urinary Tract: Adrenal glands are unremarkable. No hydronephrosis. There is punctate radiopaque density near the left UVJ (series 2, image 72), which may represent a punctate renal calculus. Bladder is unremarkable. Stomach/Bowel: Stomach is within normal limits. Appendix appears normal. No evidence of bowel wall thickening, distention, or inflammatory changes. Vascular/Lymphatic: No significant vascular findings are present. No enlarged abdominal or pelvic lymph nodes. Reproductive: Uterus and bilateral adnexa are unremarkable. Other: Small fat containing umbilical hernia. Musculoskeletal: No acute or significant osseous findings. IMPRESSION: There is a possible punctate calculus near the left UVJ. No hydronephrosis or additional renal calculi. Electronically Signed   By: Beryle Flock M.D.   On: 06/19/2022 16:51    Procedures Procedures    Medications Ordered in ED Medications  alum & mag hydroxide-simeth (MAALOX/MYLANTA) 200-200-20 MG/5ML suspension 30 mL (30 mLs Oral Given 06/19/22 1528)  famotidine (PEPCID) IVPB 20 mg premix (0 mg Intravenous Stopped 06/19/22 1710)  ketorolac (TORADOL) 15 MG/ML injection 15 mg (15 mg Intravenous Given 06/19/22 1529)    ED Course/ Medical Decision Making/ A&P Clinical Course as of 06/19/22 1713  Mon Jun 19, 2022  1629 Patient reevaluated and noted improvement of her symptoms with treatment regimen in the ED. [SB]  1710 Discussed  discharge treatment plan with patient at bedside. Answered all available questions. Pt appears safe for discharge at this time.  [SB]    Clinical Course User Index [SB] Jamee Keach A, PA-C                           Medical Decision Making Amount and/or Complexity of Data Reviewed Labs: ordered. Radiology: ordered.  Risk OTC drugs. Prescription drug management.   Patient presents to the emergency department with concerns for left flank pain intermittently for the past 1-2 months.  Notes that prior to onset of her symptoms she was helping friends move and lifting heavy items.  Has associated dysuria.  Patient afebrile.  On exam patient with mild epigastric tenderness to palpation.  Left CVA tenderness to palpation.  Otherwise no acute cardiovascular respiratory exam findings. Differential diagnosis includes pancreatitis, cholecystitis, pyelonephritis, nephrolithiasis, acute cystitis, GERD.  Labs:  I ordered, and personally interpreted labs.  The pertinent results include:   Urinalysis unremarkable. Lipase at 34 and unremarkable. I-STAT  beta-hCG unremarkable. CBC without leukocytosis. CMP unremarkable  Imaging: I ordered imaging studies including CT renal stone study I independently visualized and interpreted imaging which showed  There is a possible punctate calculus near the left UVJ. No  hydronephrosis or additional renal calculi.  I agree with the radiologist interpretation  Medications:  I ordered medication including Mylanta, Pepcid, Toradol for symptom management. Reevaluation of the patient after these medicines and interventions, I reevaluated the patient and found that they have improved I have reviewed the patients home medicines and have made adjustments as needed   Disposition: Presentation suspicious for ureterolithiasis.  Doubt pyelonephritis or nephrolithiasis at this time.  Doubt acute cystitis at this time.  Doubt pancreatitis or cholecystitis at this time.   Doubt GERD.  After consideration of the diagnostic results and the patients response to treatment, I feel that the patient would benefit from Discharge home.  Patient will be discharged home with a prescription for Flomax.  Patient instructed to follow-up with primary care provider regarding today's ED visit. Supportive care measures and strict return precautions discussed with patient at bedside. Pt acknowledges and verbalizes understanding. Pt appears safe for discharge. Follow up as indicated in discharge paperwork.   This chart was dictated using voice recognition software, Dragon. Despite the best efforts of this provider to proofread and correct errors, errors may still occur which can change documentation meaning.  Final Clinical Impression(s) / ED Diagnoses Final diagnoses:  Left flank pain  Ureterolithiasis    Rx / DC Orders ED Discharge Orders          Ordered    tamsulosin (FLOMAX) 0.4 MG CAPS capsule  Daily        06/19/22 1712              Monet North A, PA-C 06/19/22 2214    Pricilla Loveless, MD 06/20/22 [phone]

## 2022-06-19 NOTE — ED Triage Notes (Signed)
Pt reports left flank pain. Pt states it has been on and of x 2 months now. Denies issues w/ urination.

## 2022-06-19 NOTE — ED Notes (Signed)
Pt A&OX4 ambulatory at d/c with independent steady gait. Pt verbalized understanding of d/c instructions, prescription and follow up care. 

## 2023-07-29 ENCOUNTER — Emergency Department (HOSPITAL_COMMUNITY): Payer: Commercial Managed Care - HMO

## 2023-07-29 ENCOUNTER — Other Ambulatory Visit: Payer: Self-pay

## 2023-07-29 ENCOUNTER — Emergency Department (HOSPITAL_COMMUNITY)
Admission: EM | Admit: 2023-07-29 | Discharge: 2023-07-29 | Disposition: A | Discharge status: Home / Self Care | Payer: Commercial Managed Care - HMO | Attending: Emergency Medicine | Admitting: Emergency Medicine

## 2023-07-29 ENCOUNTER — Encounter (HOSPITAL_COMMUNITY): Payer: Self-pay | Admitting: *Deleted

## 2023-07-29 DIAGNOSIS — M25522 Pain in left elbow: Secondary | ICD-10-CM | POA: Diagnosis present

## 2023-07-29 DIAGNOSIS — S59902A Unspecified injury of left elbow, initial encounter: Secondary | ICD-10-CM | POA: Insufficient documentation

## 2023-07-29 DIAGNOSIS — W19XXXA Unspecified fall, initial encounter: Secondary | ICD-10-CM | POA: Insufficient documentation

## 2023-07-29 MED ORDER — IBUPROFEN 200 MG PO TABS
600.0000 mg | ORAL_TABLET | ORAL | Status: AC
  Administered 2023-07-29: 600 mg via ORAL
  Filled 2023-07-29: qty 3
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
## 2023-07-29 NOTE — Discharge Instructions (Signed)
You have evidence of possible injury to the tendons/ligaments of your elbow the which resulted in chips off of the bone.  For this reason you been placed in splint.  Please follow-up with the orthopedic doctor listed below.  Please call their office on Monday morning to schedule follow-up appointment and return to the ER with any new severe symptoms.

## 2023-07-29 NOTE — Progress Notes (Signed)
Orthopedic Tech Progress Note Patient Details:  Christina Avila 04-08-1991 [id number]  Ortho Devices Type of Ortho Device: Arm sling, Post (long arm) splint Ortho Device/Splint Location: lue Ortho Device/Splint Interventions: Ordered, Application, Adjustment   Post Interventions Patient Tolerated: Well Instructions Provided: Care of device, Adjustment of device  [name] 07/29/2023, 7:15 AM
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
## 2023-07-31 ENCOUNTER — Ambulatory Visit: Payer: Commercial Managed Care - HMO | Admitting: Physician Assistant

## 2023-07-31 DIAGNOSIS — M25522 Pain in left elbow: Secondary | ICD-10-CM | POA: Diagnosis not present

## 2023-07-31 MED ORDER — HYDROCODONE-ACETAMINOPHEN 5-325 MG PO TABS: 1.0000 | ORAL_TABLET | Freq: Three times a day (TID) | ORAL | 0 refills | Status: DC | PRN

## 2023-07-31 NOTE — Progress Notes (Signed)
Office Visit Note   Patient: Christina Avila           Date of Birth: 1991/08/09           MRN: [id number] Visit Date: 07/31/2023              Requested by: Marcine Matar, MD [address] [place],  [place] [address] PCP: Marcine Matar, MD   Assessment & Plan: Visit Diagnoses:  1. Pain in left elbow     Plan: Pression is chronic left elbow pain with small avulsion fractures to the olecranon and capitellum.  We will place her in a long-arm splint for the next week.  Will have her follow-up next week for recheck.  Will begin range of motion at that time.  I sent in Norco to take for pain.  Call with concerns or questions.  Follow-Up Instructions: Return in about 1 week (around 08/07/2023).   Orders:  No orders of the defined types were placed in this encounter.  No orders of the defined types were placed in this encounter.     Procedures: No procedures performed   Clinical Data: No additional findings.   Subjective: Chief Complaint  Patient presents with   Left Elbow - Injury    HPI patient is a pleasant 32 year old left-hand-dominant female who comes in today following an injury to her left elbow.  On 07/28/2023, she slipped landing backwards on her left elbow.  She was seen in the ED where it was noted that she had small fractures to the olecranon process, capitellum as well as a moderate joint effusion.  She was placed in a long-arm splint.  She is here today for follow-up.  Review of Systems as detailed in HPI.  All others reviewed and are negative.   Objective: Vital Signs: LMP 07/26/2023   Physical Exam well-developed well-nourished female no acute distress.  Alert and oriented x 3.  Ortho Exam left elbow exam: Moderate swelling to the left upper extremity.  She does exhibit guarding on exam but appears to be tender throughout the entire elbow.  Increased pain with elbow extension, flexion, supination and pronation.  She is able to flex and  extend her wrist.  Specialty Comments:  No specialty comments available.  Imaging: No new imaging   PMFS History: Patient Active Problem List   Diagnosis Date Noted   Boxer's metacarpal fracture, neck, closed 02/04/2018   Hernia    Past Medical History:  Diagnosis Date   Abdominal pain, other specified site    Asthma    Sickle cell anemia (HCC)    has trait    Family History  Problem Relation Age of Onset   Diabetes Mother    Hyperlipidemia Mother     Past Surgical History:  Procedure Laterality Date   HERNIA REPAIR     umbilical at age 34   UMBILICAL HERNIA REPAIR     Social History   Occupational History   Not on file  Tobacco Use   Smoking status: Every Day    Current packs/day: 1.00    Types: Cigars, Cigarettes   Smokeless tobacco: Never  Vaping Use   Vaping status: Never Used  Substance and Sexual Activity   Alcohol use: Yes    Comment: OCC   Drug use: No    Types: Marijuana    Comment: former user   Sexual activity: Not on file

## 2023-08-07 ENCOUNTER — Ambulatory Visit: Payer: Commercial Managed Care - HMO | Admitting: Orthopaedic Surgery

## 2023-08-07 ENCOUNTER — Encounter: Payer: Self-pay | Admitting: Orthopaedic Surgery

## 2023-08-07 DIAGNOSIS — M25522 Pain in left elbow: Secondary | ICD-10-CM | POA: Diagnosis not present

## 2023-08-07 NOTE — Progress Notes (Signed)
Office Visit Note   Patient: Christina Avila           Date of Birth: April 26, 1991           MRN: [id number] Visit Date: 08/07/2023              Requested by: Marcine Matar, MD [address] [place],  [place] [address] PCP: Marcine Matar, MD   Assessment & Plan: Visit Diagnoses:  1. Pain in left elbow     Plan: Ms. Wickens is a 32 year old female with a left elbow injury.  At this point we will discontinue the splint and begin range of motion.  I have sent a referral to OT.  Ace bandage provided for compression.  Recheck in 4 weeks.  Follow-Up Instructions: Return in about 4 weeks (around 09/04/2023) for Christina Avila.   Orders:  Orders Placed This Encounter  Procedures   Ambulatory referral to Occupational Therapy   No orders of the defined types were placed in this encounter.     Procedures: No procedures performed   Clinical Data: No additional findings.   Subjective: Chief Complaint  Patient presents with   Left Elbow - Follow-up, Fracture    HPI Ms. [name] returns today for follow-up for left elbow injury.  She reports that the symptoms have improved.  She is able to wiggle her fingers now.  Review of Systems  Constitutional: Negative.   HENT: Negative.    Eyes: Negative.   Respiratory: Negative.    Cardiovascular: Negative.   Endocrine: Negative.   Musculoskeletal: Negative.   Neurological: Negative.   Hematological: Negative.   Psychiatric/Behavioral: Negative.    All other systems reviewed and are negative.    Objective: Vital Signs: LMP 07/26/2023   Physical Exam Vitals and nursing note reviewed.  Constitutional:      Appearance: She is well-developed.  HENT:     Head: Normocephalic and atraumatic.  Pulmonary:     Effort: Pulmonary effort is normal.  Abdominal:     Palpations: Abdomen is soft.  Musculoskeletal:     Cervical back: Neck supple.  Skin:    General: Skin is warm.     Capillary Refill:  Capillary refill takes less than 2 seconds.  Neurological:     Mental Status: She is alert and oriented to person, place, and time.  Psychiatric:        Behavior: Behavior normal.        Thought Content: Thought content normal.        Judgment: Judgment normal.     Ortho Exam Exam of the left upper extremity shows improvement in swelling and range of motion of the elbow and wrist Specialty Comments:  No specialty comments available.  Imaging: No results found.   PMFS History: Patient Active Problem List   Diagnosis Date Noted   Boxer's metacarpal fracture, neck, closed 02/04/2018   Hernia    Past Medical History:  Diagnosis Date   Abdominal pain, other specified site    Asthma    Sickle cell anemia (HCC)    has trait    Family History  Problem Relation Age of Onset   Diabetes Mother    Hyperlipidemia Mother     Past Surgical History:  Procedure Laterality Date   HERNIA REPAIR     umbilical at age 48   UMBILICAL HERNIA REPAIR     Social History   Occupational History   Not on file  Tobacco Use  Smoking status: Every Day    Current packs/day: 1.00    Types: Cigars, Cigarettes   Smokeless tobacco: Never  Vaping Use   Vaping status: Never Used  Substance and Sexual Activity   Alcohol use: Yes    Comment: OCC   Drug use: No    Types: Marijuana    Comment: former user   Sexual activity: Not on file

## 2023-08-08 ENCOUNTER — Encounter: Payer: Self-pay | Admitting: Radiology

## 2023-08-08 ENCOUNTER — Telehealth: Payer: Self-pay | Admitting: Orthopaedic Surgery

## 2023-08-08 NOTE — Telephone Encounter (Signed)
Approve on the note.

## 2023-08-08 NOTE — Telephone Encounter (Signed)
No restrictions  Thanks

## 2023-08-08 NOTE — Telephone Encounter (Signed)
Approve on the work note with no restrictions.

## 2023-08-08 NOTE — Telephone Encounter (Signed)
Can patient have a note that says "no restrictions"?  Last note said she can return, but with no use of left arm.  Please advise

## 2023-08-08 NOTE — Telephone Encounter (Signed)
Pt needs note stating she has no restrictions pt already got a note stating she can return to work

## 2023-08-08 NOTE — Telephone Encounter (Signed)
Spoke with patient. Work note created and sent to [employer] account as requested.

## 2023-08-09 ENCOUNTER — Encounter: Payer: Commercial Managed Care - HMO | Admitting: Rehabilitative and Restorative Service Providers"

## 2024-01-09 ENCOUNTER — Encounter: Payer: Self-pay | Admitting: *Deleted

## 2024-01-09 ENCOUNTER — Other Ambulatory Visit: Payer: Self-pay

## 2024-01-09 ENCOUNTER — Ambulatory Visit
Admission: RE | Admit: 2024-01-09 | Discharge: 2024-01-09 | Disposition: A | Discharge status: Home / Self Care | Source: Ambulatory Visit | Attending: Family Medicine | Admitting: Family Medicine

## 2024-01-09 ENCOUNTER — Ambulatory Visit
Admission: RE | Admit: 2024-01-09 | Discharge: 2024-01-09 | Discharge status: Home / Self Care | Source: Ambulatory Visit

## 2024-01-09 DIAGNOSIS — R519 Headache, unspecified: Secondary | ICD-10-CM

## 2024-01-09 DIAGNOSIS — Z8669 Personal history of other diseases of the nervous system and sense organs: Secondary | ICD-10-CM | POA: Diagnosis not present

## 2024-01-09 HISTORY — DX: Disorder of kidney and ureter, unspecified: N28.9

## 2024-01-09 HISTORY — DX: Calculus of kidney: N20.0

## 2024-01-09 LAB — POCT FASTING CBG KUC MANUAL ENTRY: POCT Glucose (KUC): 85 mg/dL (ref 70–99)

## 2024-01-09 MED ORDER — DEXAMETHASONE SODIUM PHOSPHATE 10 MG/ML IJ SOLN
10.0000 mg | Freq: Once | INTRAMUSCULAR | Status: AC
  Administered 2024-01-09: 10 mg via INTRAMUSCULAR

## 2024-01-09 MED ORDER — IBUPROFEN 800 MG PO TABS
800.0000 mg | ORAL_TABLET | Freq: Once | ORAL | Status: AC
Start: 2024-01-09 — End: 2024-01-09
  Administered 2024-01-09: 800 mg via ORAL

## 2024-01-09 NOTE — ED Provider Notes (Signed)
 [facility]   [id number] 01/09/24 Arrival Time: 4098  ASSESSMENT & PLAN:  1. Acute intractable headache, unspecified headache type   2. History of blurry vision    Meds ordered this encounter  Medications   ibuprofen (ADVIL) tablet 800 mg   dexamethasone (DECADRON) injection 10 mg   Normal neurological exam. Without fever, focal neuro logical deficits, nuchal rigidity, or change in vision. No indication for neurodiagnostic workup at this time. Ques tension HA vs migraine-like HA. No formal dx of migraines. Reassured, BP normal here today.  Results for orders placed or performed during the hospital encounter of 01/09/24  POCT CBG (manual entry)   Collection Time: 01/09/24 11:20 AM  Result Value Ref Range   POCT Glucose ([facility]) 85 70 - 99 mg/dL   Recommend optometry/ophthalmology appt for vision check.  Recommend:  Follow-up Information     Marcine Matar, MD.   Specialty: Internal Medicine Why: To discuss the elevated blood pressure readings you're having at home. Contact information: [address] [place] [address] [phone]         [facility] at [facility].   Specialty: Emergency Medicine Why: If your headache worsens in any way. Contact information: [address] [place] [address] [phone]                 Reviewed expectations re: course of current medical issues. Questions answered. Outlined signs and symptoms indicating need for more acute intervention. Patient verbalized understanding. After Visit Summary given.   SUBJECTIVE: History from: Patient. Patient is able to give a clear and coherent history.  Christina Avila is a 33 y.o. female who presents with complaint of a fairly persistent headache. Onset gradual, 4-5 days ago; "off and on". Location: bilateral, temporal, frontal without radiation. History of headaches: yes; same symptoms; this one lasting  a bit longer than usual. Precipitating factors include: none which have been determined.  Associated symptoms: Preceding aura: no. Nausea/vomiting: initial nausea without emesis; denies current nausea. Vision changes: occasional blurry vision; FH of DM. Denies urinary symptoms. Increased sensitivity to light and to noises: mild. Fever: denies. Sinus pressure/congestion: denies. Extremity weakness: denies. Ibuprofen does help. Able to sleep at night but wakes in morning with same HA. Denies depression, dizziness, loss of balance, and speech difficulties. Denies head injury.   OBJECTIVE:  Vitals:   01/09/24 1011  BP: 105/73  Pulse: 90  Resp: 18  Temp: 98.4 F (36.9 C)  TempSrc: Oral  SpO2: 95%    General appearance: alert; NAD but appears fatigued HENT: normocephalic; atraumatic; without frontal sinus TTP Eyes: PERRLA; EOMI; conjunctivae normal Neck: supple with FROM Lungs: clear to auscultation bilaterally; unlabored respirations Extremities: no edema; symmetrical with no gross deformities Skin: warm and dry Neurologic: alert; speech is fluent and clear without dysarthria or aphasia; CN 2-12 grossly intact; no facial droop; normal gait Psychological: alert and cooperative; normal mood and affect  Labs: Results for orders placed or performed during the hospital encounter of 01/09/24  POCT CBG (manual entry)   Collection Time: 01/09/24 11:20 AM  Result Value Ref Range   POCT Glucose ([facility]) 85 70 - 99 mg/dL   Labs Reviewed  POCT FASTING CBG [facility] MANUAL ENTRY - Normal    Imaging: No results found.  No Known Allergies  Past Medical History:  Diagnosis Date   Abdominal pain, other specified site    Asthma    Kidney stone    Renal  disorder    Sickle cell anemia (HCC)    has trait   Social History   Socioeconomic History   Marital status: Single    Spouse name: Not on file   Number of children: Not on file   Years of education: Not on file   Highest education  level: Not on file  Occupational History   Not on file  Tobacco Use   Smoking status: Former    Current packs/day: 1.00    Types: Cigars, Cigarettes   Smokeless tobacco: Never  Vaping Use   Vaping status: Every Day  Substance and Sexual Activity   Alcohol use: Yes    Comment: OCC   Drug use: No    Types: Marijuana    Comment: former user   Sexual activity: Not on file  Other Topics Concern   Not on file  Social History Narrative   Not on file   Social Drivers of Health   Financial Resource Strain: Not on file  Food Insecurity: Not on file  Transportation Needs: Not on file  Physical Activity: Not on file  Stress: Not on file  Social Connections: Not on file  Intimate Partner Violence: Not on file   Family History  Problem Relation Age of Onset   Diabetes Mother    Hyperlipidemia Mother    Past Surgical History:  Procedure Laterality Date   HERNIA REPAIR     umbilical at age 18   UMBILICAL HERNIA REPAIR        Mardella Layman, MD 01/09/24 1150

## 2024-01-09 NOTE — ED Triage Notes (Signed)
 Recurrent HA x 5 days with n/v. Last emesis was Friday. Using tylenol without relief. States "I think it's my blood pressure"
